# Patient Record
Sex: Female | Born: 1975 | Race: Black or African American | Hispanic: No | Smoking: Former smoker
Health system: Southern US, Community
[De-identification: ages and names within clinical notes are randomized; demographics above are authoritative.]

## PROBLEM LIST (undated history)

## (undated) DIAGNOSIS — Z8041 Family history of malignant neoplasm of ovary: Secondary | ICD-10-CM

## (undated) DIAGNOSIS — Z800 Family history of malignant neoplasm of digestive organs: Secondary | ICD-10-CM

## (undated) DIAGNOSIS — E349 Endocrine disorder, unspecified: Secondary | ICD-10-CM

## (undated) DIAGNOSIS — G4730 Sleep apnea, unspecified: Secondary | ICD-10-CM

## (undated) DIAGNOSIS — E282 Polycystic ovarian syndrome: Secondary | ICD-10-CM

## (undated) DIAGNOSIS — E785 Hyperlipidemia, unspecified: Secondary | ICD-10-CM

## (undated) DIAGNOSIS — N8501 Benign endometrial hyperplasia: Secondary | ICD-10-CM

## (undated) DIAGNOSIS — N6320 Unspecified lump in the left breast, unspecified quadrant: Secondary | ICD-10-CM

## (undated) DIAGNOSIS — F329 Major depressive disorder, single episode, unspecified: Secondary | ICD-10-CM

## (undated) DIAGNOSIS — Z8042 Family history of malignant neoplasm of prostate: Secondary | ICD-10-CM

## (undated) DIAGNOSIS — D649 Anemia, unspecified: Secondary | ICD-10-CM

## (undated) DIAGNOSIS — F32A Depression, unspecified: Secondary | ICD-10-CM

## (undated) DIAGNOSIS — N6452 Nipple discharge: Secondary | ICD-10-CM

## (undated) DIAGNOSIS — Z803 Family history of malignant neoplasm of breast: Secondary | ICD-10-CM

## (undated) DIAGNOSIS — N6011 Diffuse cystic mastopathy of right breast: Secondary | ICD-10-CM

## (undated) DIAGNOSIS — Z789 Other specified health status: Secondary | ICD-10-CM

## (undated) HISTORY — DX: Unspecified lump in the left breast, unspecified quadrant: N63.20

## (undated) HISTORY — DX: Family history of malignant neoplasm of ovary: Z80.41

## (undated) HISTORY — DX: Anemia, unspecified: D64.9

## (undated) HISTORY — DX: Nipple discharge: N64.52

## (undated) HISTORY — DX: Depression, unspecified: F32.A

## (undated) HISTORY — DX: Hyperlipidemia, unspecified: E78.5

## (undated) HISTORY — DX: Family history of malignant neoplasm of digestive organs: Z80.0

## (undated) HISTORY — DX: Major depressive disorder, single episode, unspecified: F32.9

## (undated) HISTORY — DX: Family history of malignant neoplasm of breast: Z80.3

## (undated) HISTORY — PX: NO PAST SURGERIES: SHX2092

## (undated) HISTORY — DX: Polycystic ovarian syndrome: E28.2

## (undated) HISTORY — DX: Family history of malignant neoplasm of prostate: Z80.42

---
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
## 1997-09-24 ENCOUNTER — Inpatient Hospital Stay (HOSPITAL_COMMUNITY): Admission: AD | Admit: 1997-09-24 | Discharge: 1997-09-24 | Payer: Self-pay | Admitting: *Deleted

## 1998-07-11 ENCOUNTER — Emergency Department (HOSPITAL_COMMUNITY): Admission: EM | Admit: 1998-07-11 | Discharge: 1998-07-11 | Payer: Self-pay

## 1999-01-02 ENCOUNTER — Emergency Department (HOSPITAL_COMMUNITY): Admission: EM | Admit: 1999-01-02 | Discharge: 1999-01-02 | Payer: Self-pay

## 1999-06-27 ENCOUNTER — Emergency Department (HOSPITAL_COMMUNITY): Admission: EM | Admit: 1999-06-27 | Discharge: 1999-06-27 | Payer: Self-pay | Admitting: Emergency Medicine

## 2001-09-19 ENCOUNTER — Emergency Department (HOSPITAL_COMMUNITY): Admission: EM | Admit: 2001-09-19 | Discharge: 2001-09-19 | Payer: Self-pay | Admitting: Emergency Medicine

## 2002-05-13 ENCOUNTER — Ambulatory Visit (HOSPITAL_BASED_OUTPATIENT_CLINIC_OR_DEPARTMENT_OTHER): Admission: RE | Admit: 2002-05-13 | Discharge: 2002-05-13 | Payer: Self-pay | Admitting: Family Medicine

## 2003-05-18 ENCOUNTER — Inpatient Hospital Stay (HOSPITAL_COMMUNITY): Admission: AD | Admit: 2003-05-18 | Discharge: 2003-05-18 | Payer: Self-pay | Admitting: Family Medicine

## 2003-09-03 ENCOUNTER — Emergency Department (HOSPITAL_COMMUNITY): Admission: EM | Admit: 2003-09-03 | Discharge: 2003-09-04 | Payer: Self-pay | Admitting: Emergency Medicine

## 2005-03-19 ENCOUNTER — Emergency Department (HOSPITAL_COMMUNITY): Admission: EM | Admit: 2005-03-19 | Discharge: 2005-03-19 | Payer: Self-pay | Admitting: *Deleted

## 2006-06-20 ENCOUNTER — Inpatient Hospital Stay (HOSPITAL_COMMUNITY): Admission: AD | Admit: 2006-06-20 | Discharge: 2006-06-21 | Payer: Self-pay | Admitting: Obstetrics & Gynecology

## 2007-05-05 ENCOUNTER — Ambulatory Visit (HOSPITAL_COMMUNITY): Admission: RE | Admit: 2007-05-05 | Discharge: 2007-05-05 | Payer: Self-pay | Admitting: Obstetrics and Gynecology

## 2012-05-28 HISTORY — PX: BREAST EXCISIONAL BIOPSY: SUR124

## 2012-06-23 ENCOUNTER — Other Ambulatory Visit: Payer: Self-pay | Admitting: *Deleted

## 2012-06-23 DIAGNOSIS — N6452 Nipple discharge: Secondary | ICD-10-CM

## 2012-06-23 LAB — HM PAP SMEAR: HM Pap smear: NEGATIVE

## 2012-07-04 ENCOUNTER — Ambulatory Visit
Admission: RE | Admit: 2012-07-04 | Discharge: 2012-07-04 | Disposition: A | Discharge status: Home / Self Care | Payer: BC Managed Care – PPO | Source: Ambulatory Visit | Attending: *Deleted | Admitting: *Deleted

## 2012-07-04 ENCOUNTER — Other Ambulatory Visit: Payer: Self-pay | Admitting: *Deleted

## 2012-07-04 DIAGNOSIS — N6452 Nipple discharge: Secondary | ICD-10-CM

## 2012-07-18 ENCOUNTER — Other Ambulatory Visit: Payer: Self-pay | Admitting: Obstetrics and Gynecology

## 2012-07-18 DIAGNOSIS — N6452 Nipple discharge: Secondary | ICD-10-CM

## 2012-07-25 ENCOUNTER — Ambulatory Visit
Admission: RE | Admit: 2012-07-25 | Discharge: 2012-07-25 | Disposition: A | Discharge status: Home / Self Care | Payer: BC Managed Care – PPO | Source: Ambulatory Visit | Attending: *Deleted | Admitting: *Deleted

## 2012-07-25 ENCOUNTER — Other Ambulatory Visit: Payer: Self-pay | Admitting: Obstetrics and Gynecology

## 2012-07-25 DIAGNOSIS — N6452 Nipple discharge: Secondary | ICD-10-CM

## 2012-09-15 ENCOUNTER — Ambulatory Visit
Admission: RE | Admit: 2012-09-15 | Discharge: 2012-09-15 | Disposition: A | Discharge status: Home / Self Care | Payer: BC Managed Care – PPO | Source: Ambulatory Visit | Attending: Obstetrics and Gynecology | Admitting: Obstetrics and Gynecology

## 2012-09-15 ENCOUNTER — Telehealth: Payer: Self-pay | Admitting: Obstetrics and Gynecology

## 2012-09-15 DIAGNOSIS — N6452 Nipple discharge: Secondary | ICD-10-CM

## 2012-09-15 MED ORDER — GADOBENATE DIMEGLUMINE 529 MG/ML IV SOLN
18.0000 mL | Freq: Once | INTRAVENOUS | Status: AC | PRN
  Administered 2012-09-15: 18 mL via INTRAVENOUS

## 2012-09-15 NOTE — Telephone Encounter (Signed)
Hi Fannie Knee,  This is a chart in mammogram hold.  Patient is now scheduled for surgical consultation.  Thank you,  Conley Simmonds

## 2012-09-15 NOTE — Telephone Encounter (Signed)
Patient was referred for left breast bloody nipple discharge.  Had MRI which showed a small mass.  Patient referred to Dr. Jamey Ripa and has appointment on 09/23/12 for a consultation.

## 2012-09-23 ENCOUNTER — Ambulatory Visit (INDEPENDENT_AMBULATORY_CARE_PROVIDER_SITE_OTHER): Payer: BC Managed Care – PPO | Admitting: Surgery

## 2012-10-02 ENCOUNTER — Ambulatory Visit (INDEPENDENT_AMBULATORY_CARE_PROVIDER_SITE_OTHER): Payer: BC Managed Care – PPO | Admitting: Surgery

## 2012-10-10 ENCOUNTER — Ambulatory Visit (INDEPENDENT_AMBULATORY_CARE_PROVIDER_SITE_OTHER): Payer: BC Managed Care – PPO | Admitting: Surgery

## 2012-10-10 ENCOUNTER — Encounter (INDEPENDENT_AMBULATORY_CARE_PROVIDER_SITE_OTHER): Payer: Self-pay | Admitting: Surgery

## 2012-10-10 VITALS — BP 116/88 | HR 80 | Temp 97.6°F | Resp 16 | Ht 64.0 in | Wt 191.0 lb

## 2012-10-10 DIAGNOSIS — N6320 Unspecified lump in the left breast, unspecified quadrant: Secondary | ICD-10-CM

## 2012-10-10 DIAGNOSIS — N63 Unspecified lump in unspecified breast: Secondary | ICD-10-CM

## 2012-10-10 HISTORY — DX: Unspecified lump in the left breast, unspecified quadrant: N63.20

## 2012-10-10 NOTE — Progress Notes (Signed)
Kristy Stafford DOB: Aug 12, 1975 MRN: [id number]                                                                                      DATE: 10/10/2012  PCP: Kristy Helling, MD Referring Provider: Harrel Lemon, MD  IMPRESSION:  Left nipple discharge, single duct, with apparent associated 8 mm subareolar mass Family history of breast cancer (mother and grandmother)  PLAN:   I have recommended left breast ductal excision.I reviewed the plans the patient and she is agreeable. I will review the MRI with the radiologist to be sure they think the small abnormality associated with the fluid producing duct.                 CC:  Chief Complaint  Patient presents with  . Breast Problem    new pt- eval lt nipple d/c    HPI:  Kristy Stafford is a 37 y.o.  female who presents for evaluation of left nipple discharge that has been persistent, but asymptomatic otherwise. She has a family history in the mother and grandmother of breast cancer. She has no other history of breast cancer and no breast symptoms at all other than spontaneous left nipple discharge. She has had 2 unsuccessful attempts at ductograms. Mammogram and ultrasound have been negative. An MRI has shown an 8 mm subareolar lesion at about the 6:00 to 7:00 position. The nipple discharge was noted by the radiologist to be at about the 4:00 position but also thought likely to be associated with the small mass.  PMH:  has a past medical history of Hyperlipidemia and Nipple discharge.  PSH:   has no past surgical history on file.  ALLERGIES:  No Known Allergies  MEDICATIONS: No current outpatient prescriptions on file.  ROS: She has filled out our 12 point review of systems and it is negative . EXAM:   VITAL SIGNS:  BP 116/88  Pulse 80  Temp(Src) 97.6 F (36.4 C) (Temporal)  Resp 16  Ht 5\' 4"  (1.626 m)  Wt 191 lb (86.637 kg)  BMI 32.77 kg/m2  LMP 09/07/2012  GENERAL:  The patient is alert, oriented, and  generally healthy-appearing, NAD. Mood and affect are normal.  HEENT:  The head is normocephalic, the eyes nonicteric, the pupils were round regular and equal. EOMs are normal. Pharynx normal. Dentition good.  NECK:  The neck is supple and there are no masses or thyromegaly.  LUNGS: Normal respirations and clear to auscultation.  HEART: Regular rhythm, with no murmurs rubs or gallops. Pulses are intact carotid dorsalis pedis and posterior tibial. No significant varicosities are noted.  BREASTS:  the breasts are somewhat large, nontender, symmetric, with no evidence of scanner nipple areolar lesions. There is no nipple discharge on the right. There is a readily expressible somewhat clear discharge from approximately the 6:00 position in the left nipple. There is no underlying palpable mass or other abnormality.  LYMPHATICS: There is no axillary or supraclavicular adenopathy noted  ABDOMEN: Soft, flat, and nontender. No masses or organomegaly is noted. No hernias are noted. Bowel sounds are normal.  EXTREMITIES:  Good range of motion, no  edema.   DATA REVIEWED:  I have reviewed the mammogram ultrasound and ductogram reports and the MRI report    Kristy Stafford Kristy Stafford 10/10/2012  CC: Kristy Lemon, MD, Kristy de Gwenevere Ghazi, MD

## 2012-10-10 NOTE — Patient Instructions (Signed)
We will schedule surgery for her move the ductal tissue from behind the left nipple that  is causing the discharge

## 2012-11-19 ENCOUNTER — Encounter (HOSPITAL_BASED_OUTPATIENT_CLINIC_OR_DEPARTMENT_OTHER): Payer: Self-pay | Admitting: *Deleted

## 2012-11-19 NOTE — Progress Notes (Signed)
To take piercing out-to bring cpap and told she would have to use post op-possibly stay overnight if any problems breathing

## 2012-11-24 ENCOUNTER — Encounter (HOSPITAL_BASED_OUTPATIENT_CLINIC_OR_DEPARTMENT_OTHER)
Admission: RE | Disposition: A | Discharge status: Home / Self Care | Payer: Self-pay | Source: Ambulatory Visit | Attending: Surgery

## 2012-11-24 ENCOUNTER — Encounter (HOSPITAL_BASED_OUTPATIENT_CLINIC_OR_DEPARTMENT_OTHER): Payer: Self-pay | Admitting: Certified Registered Nurse Anesthetist

## 2012-11-24 ENCOUNTER — Ambulatory Visit (HOSPITAL_BASED_OUTPATIENT_CLINIC_OR_DEPARTMENT_OTHER): Payer: BC Managed Care – PPO | Admitting: Certified Registered Nurse Anesthetist

## 2012-11-24 ENCOUNTER — Ambulatory Visit (HOSPITAL_BASED_OUTPATIENT_CLINIC_OR_DEPARTMENT_OTHER)
Admission: RE | Admit: 2012-11-24 | Discharge: 2012-11-24 | Disposition: A | Discharge status: Home / Self Care | Payer: BC Managed Care – PPO | Source: Ambulatory Visit | Attending: Surgery | Admitting: Surgery

## 2012-11-24 DIAGNOSIS — G4730 Sleep apnea, unspecified: Secondary | ICD-10-CM | POA: Insufficient documentation

## 2012-11-24 DIAGNOSIS — N6320 Unspecified lump in the left breast, unspecified quadrant: Secondary | ICD-10-CM

## 2012-11-24 DIAGNOSIS — E785 Hyperlipidemia, unspecified: Secondary | ICD-10-CM | POA: Insufficient documentation

## 2012-11-24 DIAGNOSIS — Z803 Family history of malignant neoplasm of breast: Secondary | ICD-10-CM | POA: Insufficient documentation

## 2012-11-24 DIAGNOSIS — N6019 Diffuse cystic mastopathy of unspecified breast: Secondary | ICD-10-CM | POA: Insufficient documentation

## 2012-11-24 DIAGNOSIS — D249 Benign neoplasm of unspecified breast: Secondary | ICD-10-CM

## 2012-11-24 HISTORY — DX: Sleep apnea, unspecified: G47.30

## 2012-11-24 HISTORY — PX: BREAST DUCTAL SYSTEM EXCISION: SHX5242

## 2012-11-24 HISTORY — DX: Other specified health status: Z78.9

## 2012-11-24 SURGERY — EXCISION DUCTAL SYSTEM BREAST
Anesthesia: General | Site: Breast | Laterality: Left | Wound class: Clean

## 2012-11-24 MED ORDER — CEFAZOLIN SODIUM-DEXTROSE 2-3 GM-% IV SOLR
2.0000 g | INTRAVENOUS | Status: AC
  Administered 2012-11-24: 2 g via INTRAVENOUS

## 2012-11-24 MED ORDER — PROPOFOL 10 MG/ML IV BOLUS
INTRAVENOUS | Status: DC | PRN
  Administered 2012-11-24: 200 mg via INTRAVENOUS

## 2012-11-24 MED ORDER — KETOROLAC TROMETHAMINE 30 MG/ML IJ SOLN: 15.0000 mg | Freq: Once | INTRAMUSCULAR | Status: DC | PRN

## 2012-11-24 MED ORDER — CHLORHEXIDINE GLUCONATE 4 % EX LIQD: 1.0000 "application " | Freq: Once | CUTANEOUS | Status: DC

## 2012-11-24 MED ORDER — LIDOCAINE HCL (CARDIAC) 20 MG/ML IV SOLN
INTRAVENOUS | Status: DC | PRN
  Administered 2012-11-24: 60 mg via INTRAVENOUS

## 2012-11-24 MED ORDER — FENTANYL CITRATE 0.05 MG/ML IJ SOLN
INTRAMUSCULAR | Status: DC | PRN
  Administered 2012-11-24 (×2): 50 ug via INTRAVENOUS

## 2012-11-24 MED ORDER — MIDAZOLAM HCL 5 MG/5ML IJ SOLN
INTRAMUSCULAR | Status: DC | PRN
  Administered 2012-11-24: 2 mg via INTRAVENOUS

## 2012-11-24 MED ORDER — LACTATED RINGERS IV SOLN
INTRAVENOUS | Status: DC
  Administered 2012-11-24 (×2): via INTRAVENOUS

## 2012-11-24 MED ORDER — ONDANSETRON HCL 4 MG/2ML IJ SOLN: 4.0000 mg | Freq: Once | INTRAMUSCULAR | Status: DC | PRN

## 2012-11-24 MED ORDER — HYDROMORPHONE HCL PF 1 MG/ML IJ SOLN
0.2500 mg | INTRAMUSCULAR | Status: DC | PRN
  Administered 2012-11-24: 0.5 mg via INTRAVENOUS

## 2012-11-24 MED ORDER — BUPIVACAINE HCL (PF) 0.25 % IJ SOLN
INTRAMUSCULAR | Status: DC | PRN
  Administered 2012-11-24: 30 mL

## 2012-11-24 MED ORDER — HYDROCODONE-ACETAMINOPHEN 5-325 MG PO TABS: 1.0000 | ORAL_TABLET | ORAL | Status: DC | PRN

## 2012-11-24 SURGICAL SUPPLY — 51 items
ADH SKN CLS APL DERMABOND .7 (GAUZE/BANDAGES/DRESSINGS) ×1
APPLICATOR COTTON TIP 6IN STRL (MISCELLANEOUS) IMPLANT
BANDAGE ELASTIC 6 VELCRO ST LF (GAUZE/BANDAGES/DRESSINGS) IMPLANT
BINDER BREAST XLRG (GAUZE/BANDAGES/DRESSINGS) ×1 IMPLANT
BLADE HEX COATED 2.75 (ELECTRODE) ×2 IMPLANT
BLADE SURG 15 STRL LF DISP TIS (BLADE) ×1 IMPLANT
BLADE SURG 15 STRL SS (BLADE) ×2
BNDG COHESIVE 6X5 TAN STRL LF (GAUZE/BANDAGES/DRESSINGS) IMPLANT
CANISTER SUCTION 1200CC (MISCELLANEOUS) ×2 IMPLANT
CHLORAPREP W/TINT 26ML (MISCELLANEOUS) ×2 IMPLANT
CLIP TI MEDIUM 6 (CLIP) IMPLANT
CLIP TI WIDE RED SMALL 6 (CLIP) IMPLANT
CLOTH BEACON ORANGE TIMEOUT ST (SAFETY) ×2 IMPLANT
COVER MAYO STAND STRL (DRAPES) ×2 IMPLANT
COVER TABLE BACK 60X90 (DRAPES) ×2 IMPLANT
DECANTER SPIKE VIAL GLASS SM (MISCELLANEOUS) IMPLANT
DERMABOND ADVANCED (GAUZE/BANDAGES/DRESSINGS) ×1
DERMABOND ADVANCED .7 DNX12 (GAUZE/BANDAGES/DRESSINGS) ×1 IMPLANT
DRAPE LAPAROTOMY TRNSV 102X78 (DRAPE) ×2 IMPLANT
DRAPE SURG 17X23 STRL (DRAPES) IMPLANT
DRAPE UTILITY XL STRL (DRAPES) ×2 IMPLANT
ELECT REM PT RETURN 9FT ADLT (ELECTROSURGICAL) ×2
ELECTRODE REM PT RTRN 9FT ADLT (ELECTROSURGICAL) ×1 IMPLANT
GLOVE BIO SURGEON STRL SZ7 (GLOVE) ×1 IMPLANT
GLOVE BIOGEL PI IND STRL 7.0 (GLOVE) IMPLANT
GLOVE BIOGEL PI INDICATOR 7.0 (GLOVE) ×1
GLOVE EUDERMIC 7 POWDERFREE (GLOVE) ×2 IMPLANT
GOWN PREVENTION PLUS XLARGE (GOWN DISPOSABLE) ×4 IMPLANT
NDL HYPO 25X1 1.5 SAFETY (NEEDLE) ×1 IMPLANT
NDL SAFETY ECLIPSE 18X1.5 (NEEDLE) IMPLANT
NEEDLE HYPO 18GX1.5 SHARP (NEEDLE) ×2
NEEDLE HYPO 25X1 1.5 SAFETY (NEEDLE) ×2 IMPLANT
NS IRRIG 1000ML POUR BTL (IV SOLUTION) ×1 IMPLANT
PACK BASIN DAY SURGERY FS (CUSTOM PROCEDURE TRAY) ×2 IMPLANT
PENCIL BUTTON HOLSTER BLD 10FT (ELECTRODE) ×2 IMPLANT
SLEEVE SCD COMPRESS KNEE MED (MISCELLANEOUS) ×2 IMPLANT
SPONGE GAUZE 4X4 12PLY (GAUZE/BANDAGES/DRESSINGS) IMPLANT
SPONGE INTESTINAL PEANUT (DISPOSABLE) IMPLANT
SPONGE LAP 4X18 X RAY DECT (DISPOSABLE) ×2 IMPLANT
STAPLER VISISTAT 35W (STAPLE) IMPLANT
SUT MNCRL AB 4-0 PS2 18 (SUTURE) ×2 IMPLANT
SUT SILK 0 TIES 10X30 (SUTURE) IMPLANT
SUT VIC AB 3-0 CT1 27 (SUTURE)
SUT VIC AB 3-0 CT1 27XBRD (SUTURE) IMPLANT
SUT VICRYL 3-0 CR8 SH (SUTURE) ×2 IMPLANT
SYR BULB 3OZ (MISCELLANEOUS) IMPLANT
SYR CONTROL 10ML LL (SYRINGE) ×2 IMPLANT
SYR TB 1ML 25GX5/8 (SYRINGE) ×1 IMPLANT
TOWEL OR NON WOVEN STRL DISP B (DISPOSABLE) ×2 IMPLANT
TUBE CONNECTING 20X1/4 (TUBING) ×2 IMPLANT
YANKAUER SUCT BULB TIP NO VENT (SUCTIONS) ×2 IMPLANT

## 2012-11-24 NOTE — Anesthesia Procedure Notes (Signed)
Procedure Name: LMA Insertion Date/Time: 11/24/2012 7:35 AM Performed by: Allysen Lazo D Pre-anesthesia Checklist: Patient identified, Emergency Drugs available, Suction available and Patient being monitored Patient Re-evaluated:Patient Re-evaluated prior to inductionOxygen Delivery Method: Circle System Utilized Preoxygenation: Pre-oxygenation with 100% oxygen Intubation Type: IV induction Ventilation: Mask ventilation without difficulty LMA: LMA inserted LMA Size: 4.0 Number of attempts: 1 Airway Equipment and Method: bite block Placement Confirmation: positive ETCO2 Tube secured with: Tape Dental Injury: Teeth and Oropharynx as per pre-operative assessment

## 2012-11-24 NOTE — Op Note (Signed)
ALONDRIA MOUSSEAU 1975/07/18 [id number] 10/10/2012  Preoperative diagnosis: left breast nipple discharge with small associated mass seen on MRI, 6:00 position  Postoperative diagnosis: same  Procedure: ductal excision left breast  Surgeon: Currie Paris, MD, FACS  Anesthesia: General   Clinical History and Indications: this patient has had a spontaneous left breast nipple discharge from a duct at about the 4:00 position. Despite several attempts a ductogram has not been able to be performed. Mammogram show no definite abnormality but an MRI showed a small nodule associated with dilated ducts at about the 6:00 position, thought to be the same area that is producing the nipple discharge.    Description of Procedure: I saw the patient in the preoperative area, confirmed the plans with her, and marked the left breast as the operative site. The patient was taken to the operating room and after satisfactory general anesthesia was obtained the left breast was prepped and draped and a timeout was performed.  I attempted to cannulate the duct previously the fluid which was in about the 6:00 position but was unsuccessful after numerous attempts. I then made a curvilinear incision at the bottom of the trailer margin. I elevated the skin up until I got to the undersurface of the nipple. There I encountered a markedly dilated duct. I dissected around it with a hemostat and divided it the dermis level of the nipple. With traction on it I was able to take wide area of tissue around this duct. Most of the subareolar tissue was fatty. I took some additional tissue around the edges and from the 4:00 position but I thought I had completely excised the subareolar ductal tissue.  I put in 30 cc of 0.25% plain Marcaine to help with postop pain control. I irrigated and made sure everything was dry. I closed in layers with 3-0 Vicryl, 4-0 Vicryl subcuticular, plus Dermabond.  The patient tolerated the procedure well.  There were no operative complications. Counts were correct. Estimated blood loss was minimal.  Currie Paris, MD, FACS 11/24/2012 8:20 AM

## 2012-11-24 NOTE — Anesthesia Preprocedure Evaluation (Addendum)
Anesthesia Evaluation  Patient identified by MRN, date of birth, ID band Patient awake    Reviewed: Allergy & Precautions  Airway Mallampati: II      Dental  (+) Teeth Intact and Dental Advisory Given   Pulmonary sleep apnea ,  breath sounds clear to auscultation        Cardiovascular Rhythm:Regular Rate:Normal     Neuro/Psych    GI/Hepatic   Endo/Other    Renal/GU      Musculoskeletal   Abdominal   Peds  Hematology   Anesthesia Other Findings   Reproductive/Obstetrics                          Anesthesia Physical Anesthesia Plan  ASA: II  Anesthesia Plan: General   Post-op Pain Management:    Induction: Intravenous  Airway Management Planned: LMA  Additional Equipment:   Intra-op Plan:   Post-operative Plan:   Informed Consent: I have reviewed the patients History and Physical, chart, labs and discussed the procedure including the risks, benefits and alternatives for the proposed anesthesia with the patient or authorized representative who has indicated his/her understanding and acceptance.   Dental advisory given  Plan Discussed with: CRNA and Anesthesiologist  Anesthesia Plan Comments:         Anesthesia Quick Evaluation

## 2012-11-24 NOTE — H&P (Signed)
  HPI: Kristy Stafford is a 37 y.o. female who presents for surgery for a left nipple discharge She has a family history in the mother and grandmother of breast cancer. She has no other history of breast cancer and no breast symptoms at all other than spontaneous left nipple discharge. She has had 2 unsuccessful attempts at ductograms. Mammogram and ultrasound have been negative. An MRI has shown an 8 mm subareolar lesion at about the 6:00 to 7:00 position. The nipple discharge was noted by the radiologist to be at about the 4:00 position but also thought likely to be associated with the small mass.  PMH: has a past medical history of Hyperlipidemia and Nipple discharge.  PSH: has no past surgical history on file.  ALLERGIES: No Known Allergies  MEDICATIONS: No current outpatient prescriptions on file.  ROS: She  has filled out our 12 point review of systems and it is negative .  EXAM:  VITAL SIGNS:  BP 116/88  Pulse 80  Temp(Src) 97.6 F (36.4 C) (Temporal)  Resp 16  Ht 5\' 4"  (1.626 m)  Wt 191 lb (86.637 kg)  BMI 32.77 kg/m2  LMP 09/07/2012  GENERAL:  The patient is alert, oriented, and generally healthy-appearing, NAD. Mood and affect are normal.  HEENT:  The head is normocephalic, the eyes nonicteric, the pupils were round regular and equal. EOMs are normal. Pharynx normal. Dentition good.  NECK:  The neck is supple and there are no masses or thyromegaly.  LUNGS:  Normal respirations and clear to auscultation.  HEART:  Regular rhythm, with no murmurs rubs or gallops. Pulses are intact carotid dorsalis pedis and posterior tibial. No significant varicosities are noted.  BREASTS:  the breasts are somewhat large, nontender, symmetric, with no evidence of scanner nipple areolar lesions. There is no nipple discharge on the right. There is a readily expressible somewhat clear discharge from approximately the 6:00 position in the left nipple. There is no underlying palpable mass or other  abnormality.  LYMPHATICS: There is no axillary or supraclavicular adenopathy noted  ABDOMEN:  Soft, flat, and nontender. No masses or organomegaly is noted. No hernias are noted. Bowel sounds are normal.  EXTREMITIES:  Good range of motion, no edema.  DATA REVIEWED: I have reviewed the mammogram ultrasound and ductogram reports and the MRI report  IMP: Left nipple discharge with possible small associated Mass  Plan: Left breast ductal excision. I reviewed plans, etc again with patient and family and all questions answered. I have marked the left breast as the operative side

## 2012-11-24 NOTE — Transfer of Care (Signed)
Immediate Anesthesia Transfer of Care Note  Patient: Kristy Stafford  Procedure(s) Performed: Procedure(s): EXCISION DUCTAL SYSTEM BREAST (Left)  Patient Location: PACU  Anesthesia Type:General  Level of Consciousness: awake and patient cooperative  Airway & Oxygen Therapy: Patient Spontanous Breathing and Patient connected to face mask oxygen  Post-op Assessment: Report given to PACU RN and Post -op Vital signs reviewed and stable  Post vital signs: Reviewed and stable  Complications: No apparent anesthesia complications

## 2012-11-24 NOTE — Anesthesia Postprocedure Evaluation (Signed)
  Anesthesia Post-op Note  Patient: [name] Performed: Procedure(s): EXCISION DUCTAL SYSTEM BREAST (Left)  Patient Location: PACU  Anesthesia Type:General  Level of Consciousness: awake, alert  and oriented  Airway and Oxygen Therapy: Patient Spontanous Breathing  Post-op Pain: mild  Post-op Assessment: Post-op Vital signs reviewed, Patient's Cardiovascular Status Stable, Respiratory Function Stable, Patent Airway and Pain level controlled  Post-op Vital Signs: stable  Complications: No apparent anesthesia complications

## 2012-11-25 ENCOUNTER — Encounter (HOSPITAL_BASED_OUTPATIENT_CLINIC_OR_DEPARTMENT_OTHER): Payer: Self-pay | Admitting: Surgery

## 2012-11-26 ENCOUNTER — Telehealth (INDEPENDENT_AMBULATORY_CARE_PROVIDER_SITE_OTHER): Payer: Self-pay | Admitting: General Surgery

## 2012-11-26 NOTE — Telephone Encounter (Signed)
Patient called back and was made aware per Haig Prophet that pathology benign.

## 2012-11-26 NOTE — Telephone Encounter (Signed)
Message copied by Liliana Cline on Wed Nov 26, 2012  9:51 AM ------      Message from: Currie Paris      Created: Tue Nov 25, 2012  5:19 PM       Tell her path is benign and as expected ------

## 2012-11-26 NOTE — Telephone Encounter (Signed)
Left message on machine for patient to call back and ask for me. To make her aware pathology benign. Awaiting call back.

## 2012-12-05 ENCOUNTER — Encounter (INDEPENDENT_AMBULATORY_CARE_PROVIDER_SITE_OTHER): Payer: BC Managed Care – PPO | Admitting: Surgery

## 2012-12-12 ENCOUNTER — Encounter (INDEPENDENT_AMBULATORY_CARE_PROVIDER_SITE_OTHER): Payer: Self-pay | Admitting: Surgery

## 2012-12-12 ENCOUNTER — Encounter (INDEPENDENT_AMBULATORY_CARE_PROVIDER_SITE_OTHER): Payer: Self-pay

## 2012-12-12 ENCOUNTER — Ambulatory Visit (INDEPENDENT_AMBULATORY_CARE_PROVIDER_SITE_OTHER): Payer: BC Managed Care – PPO | Admitting: Surgery

## 2012-12-12 VITALS — BP 130/68 | HR 64 | Resp 16 | Ht 64.0 in | Wt 189.0 lb

## 2012-12-12 DIAGNOSIS — N63 Unspecified lump in unspecified breast: Secondary | ICD-10-CM

## 2012-12-12 DIAGNOSIS — N6320 Unspecified lump in the left breast, unspecified quadrant: Secondary | ICD-10-CM

## 2012-12-12 NOTE — Patient Instructions (Signed)
We will see you again on an as needed basis. Please call the office at [phone] if you have any questions or concerns. Thank you for allowing us to take care of you.  

## 2012-12-12 NOTE — Progress Notes (Signed)
NAME: Kristy Stafford                                            DOB: 07/03/1975 DATE: 12/12/2012                                                  MRN: [id number]  CC:  Chief Complaint  Patient presents with  . Routine Post Op    left breast    HPI: This patient comes in for post op follow-up .Sheunderwent left ductal excision on 11/24/12. She feels that she is doing well.  PE:  VITAL SIGNS: BP 130/68  Pulse 64  Resp 16  Ht 5\' 4"  (1.626 m)  Wt 189 lb (85.73 kg)  BMI 32.43 kg/m2  LMP 11/12/2012  General: The patient appears to be healthy, NAD Incision: Healing nicely  DATA REVIEWED: Diagnosis Breast, excision, Left - DUCTAL PAPILLOMA - FIBROCYSTIC CHANGES. - NO EVIDENCE OF MALIGNANCY IDENTIFIED. 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 There are also benign fibrocystic changes. No malignancy is identified. (JDP;gt, 11/25/12) Jimmy Picket MD Pathologist, Electronic Signature  IMPRESSION: The patient is doing well S/P left ductal excision.    PLAN: RTC PRN I gave the patient a copy of the pathology report and reviewed it with her

## 2013-01-15 ENCOUNTER — Telehealth: Payer: Self-pay | Admitting: *Deleted

## 2013-01-15 NOTE — Telephone Encounter (Signed)
Patient notified of need to schedule a follow up appt. With Dr. Edward Jolly from OV paper chart of 07/07/2012 of irregular menses. Appointment made for Monday , 01/19/2013 with Dr. Edward Jolly @ 11:15am.

## 2013-01-19 ENCOUNTER — Ambulatory Visit (INDEPENDENT_AMBULATORY_CARE_PROVIDER_SITE_OTHER): Payer: BC Managed Care – PPO | Admitting: Obstetrics and Gynecology

## 2013-01-19 ENCOUNTER — Encounter: Payer: Self-pay | Admitting: Obstetrics and Gynecology

## 2013-01-19 ENCOUNTER — Telehealth: Payer: Self-pay | Admitting: Obstetrics and Gynecology

## 2013-01-19 ENCOUNTER — Ambulatory Visit: Payer: Self-pay | Admitting: Obstetrics and Gynecology

## 2013-01-19 VITALS — BP 124/76 | HR 70 | Ht 64.0 in | Wt 190.0 lb

## 2013-01-19 DIAGNOSIS — N926 Irregular menstruation, unspecified: Secondary | ICD-10-CM

## 2013-01-19 DIAGNOSIS — N939 Abnormal uterine and vaginal bleeding, unspecified: Secondary | ICD-10-CM

## 2013-01-19 NOTE — Telephone Encounter (Signed)
Patient is thinks she has a credit in [place] (I do not see a credit) Patient says at one of her appointments she paid a copay and was not seen. Patient was told this would be applied to her next visit.

## 2013-01-19 NOTE — Patient Instructions (Signed)

## 2013-01-19 NOTE — Progress Notes (Signed)
Patient ID: [facility], female   DOB: 12-Sep-1975, 37 y.o.   MRN: [id number] GYNECOLOGY VISIT  PCP:    Dr. Johnnette Barrios  Referring provider:   HPI: 37 y.o.   Married  [place]  female   [phone] with Patient's last menstrual period was 01/10/2013.   here for  Abnormal Uterine Bleeding  Menses occurring range from a normal one week of bleeding to bloody discharge that requires only panty line use and last for a couple of weeks. Can skip cycles also. Had a normal TSH, prolactin, LH, and FSH.  LH>>FSH.  No pain.   Recent fever due to a tooth abscess - just had tooth extraction 5 days ago.    Recent reunion with ex-husband.   Considering future childbearing.  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  Pathology benign.  Patient states the breast feels really firm.  Labs today:     UPT:  negative  GYNECOLOGIC HISTORY: Patient's last menstrual period was 01/10/2013. Menses:  irregular occurring approximately every 20-120 days with spotting approximately 7-20 days per month Bleeding:  Patient may go months without a cycle but also may have 2 cycles in one month.  Sexually active:  yes Partner preference: female Contraception:  none Hormone therapy:  DES exposure:  denies Blood transfusions:  none Sexually transmitted diseases:  The patient denies history of sexually transmitted disease. Previous GYN Procedures:  no Last mammogram:  abnormal: left focal duct ectasia Date:   06/2012               Location: [facility] Last pap:  normal Date: 06-23-12 History of abnormal pap smear:  no   OB History   Grav Para Term Preterm Abortions TAB SAB Ect Mult Living   3    3  3            Family History  Problem Relation Age of Onset  . Cancer Mother     breast  . Breast cancer Mother   . Diabetes Father   . Cancer Maternal Grandmother     breast  . Breast cancer Maternal Grandmother   . Ovarian cancer Maternal Aunt     There are  no active problems to display for this patient.   Past Medical History  Diagnosis Date  . Hyperlipidemia   . Nipple discharge     left breast  . Sleep apnea     has a cpap for 3 yr-told she will need to bring day of surgery and use post op  . Medical history non-contributory   . Breast mass, left 10/10/2012    Excised 11/24/12, papilloma on path     Past Surgical History  Procedure Laterality Date  . No past surgeries    . Breast ductal system excision Left 11/24/2012    Procedure: EXCISION DUCTAL SYSTEM BREAST;  Surgeon: Currie Paris, MD;  Location: [facility];  Service: General;  Laterality: Left;    ALLERGIES: Review of patient's allergies indicates no known allergies.  Current Outpatient Prescriptions  Medication Sig Dispense Refill  . HYDROcodone-acetaminophen (NORCO) 5-325 MG per tablet Take 1 tablet by mouth every 4 (four) hours as needed for pain.  30 tablet  0  . naproxen sodium (ANAPROX) 220 MG tablet Take 220 mg by mouth as needed.       No current facility-administered medications for this visit.     ROS:  Pertinent items are noted in HPI.  SOCIAL HISTORY:  Divorced.  Smokes cigars.  PHYSICAL EXAMINATION:    BP 124/76  Pulse 70  Ht 5\' 4"  (1.626 m)  Wt 190 lb (86.183 kg)  BMI 32.6 kg/m2  LMP 01/10/2013   Wt Readings from Last 3 Encounters:  01/19/13 190 lb (86.183 kg)  12/12/12 189 lb (85.73 kg)  11/24/12 192 lb 6.4 oz (87.272 kg)     Ht Readings from Last 3 Encounters:  01/19/13 5\' 4"  (1.626 m)  12/12/12 5\' 4"  (1.626 m)  11/24/12 5\' 4"  (1.626 m)    General appearance: alert, cooperative and appears stated age Head: Normocephalic, without obvious abnormality, atraumatic Neck: no adenopathy, supple, symmetrical, trachea midline and thyroid not enlarged, symmetric, no tenderness/mass/nodules Lungs: clear to auscultation bilaterally Breasts: Inspection negative, No nipple retraction or dimpling, No nipple discharge or bleeding, No  axillary or supraclavicular adenopathy, Normal to palpation without dominant masses on the right.  Left breast with infraareolar incision and 2.5 cm firm mass under the areola and to the left. Heart: regular rate and rhythm Abdomen: obese, soft, non-tender; bowel sounds normal; no masses,  no organomegaly Extremities: extremities normal, atraumatic, no cyanosis or edema Skin: Skin color, texture, turgor normal. No rashes or lesions Lymph nodes: Cervical, supraclavicular, and axillary nodes normal. No abnormal inguinal nodes palpated Neurologic: Grossly normal   Pelvic: External genitalia:  no lesions              Urethra:  normal appearing urethra with no masses, tenderness or lesions              Bartholins and Skenes: normal                 Vagina: normal appearing vagina with normal color and discharge, no lesions              Cervix: normal appearance            Bimanual Exam:  Uterus:  uterus is normal size, shape, consistency and nontender                                      Adnexa: normal adnexa in size, nontender and no masses                                      Rectovaginal: Confirms                                      Anus:  normal sphincter tone, no lesions  ASSESSMENT   Status post left breast papilloma excision 2 months ago.  Induration of left breast. Long history of anovulatory bleeding Considering future childbearing. Smoker age 37 years old.  PLAN  Patient will contact Dr. Tenna Child office for a recheck.  She declines having our office call to schedule. Return for pelvic ultrasound, sonohysterogram, and endometrial biopsy. I discussed future pregnancy planning and 37 status.  Declines STD testing today. Not a candidate for combined contraception due to tobacco use.    An After Visit Summary was printed and given to the patient.

## 2013-01-29 ENCOUNTER — Telehealth: Payer: Self-pay | Admitting: Obstetrics and Gynecology

## 2013-01-29 NOTE — Telephone Encounter (Signed)
LMTCB to discuss ins benefits and schedule [facility]/Endo Bx.

## 2013-02-12 ENCOUNTER — Ambulatory Visit (INDEPENDENT_AMBULATORY_CARE_PROVIDER_SITE_OTHER): Payer: BC Managed Care – PPO | Admitting: Obstetrics and Gynecology

## 2013-02-12 ENCOUNTER — Ambulatory Visit (INDEPENDENT_AMBULATORY_CARE_PROVIDER_SITE_OTHER): Payer: BC Managed Care – PPO

## 2013-02-12 DIAGNOSIS — R9389 Abnormal findings on diagnostic imaging of other specified body structures: Secondary | ICD-10-CM

## 2013-02-12 DIAGNOSIS — N926 Irregular menstruation, unspecified: Secondary | ICD-10-CM

## 2013-02-12 DIAGNOSIS — N939 Abnormal uterine and vaginal bleeding, unspecified: Secondary | ICD-10-CM

## 2013-02-12 DIAGNOSIS — N915 Oligomenorrhea, unspecified: Secondary | ICD-10-CM

## 2013-02-12 NOTE — Patient Instructions (Signed)
We will call you with the test results in about a week,

## 2013-02-12 NOTE — Progress Notes (Signed)
Patient ID: [facility], female   DOB: 06-21-1975, 37 y.o.   MRN: [id number]  Subjective  Patient is here for pelvic ultrasound and endometrial biopsy. LMP was 6 days ago.  Bleeding was light brown.  Patient has know polycystic ovarian disease and oligomenorrhea.  Occasionally smokes.   Would welcome pregnancy if it occurred.  Not really preventing pregnancy.  Has high cholesterol and normal glucose.  - Dr. Parke Simmers.   Objective  Ultrasound - EMS 12 mm, no fibroids, ovaries WNL, no free fluid.  Sonohysterogram   Consent performed.  Open sided speculum place.  Sterile prep of cervix with Betadine. Tenaculum to anterior cervical lip.  Cannula placed.  Speculum withdrawn Saline injected.  Images taken.  No complications.  Bleeding noted after sonohysterogram performed - came from the lush lining of the endometrium being partially disrupted.  Endometrial biopsy  Consent performed.  Speculum placed.  Reprep with betadine.  Pipelle passed x 2 without complication.  Tissue to pathology.  Minimal EBL.  Assessment  Abnormal uterine bleeding. Oligomenorrhea. Known polycystic ovarian disease. Would welcome pregnancy.  Plan  Will await EMB results. For any progesterone treatment, will use Prometrium. Start PNV

## 2013-02-16 ENCOUNTER — Telehealth: Payer: Self-pay | Admitting: Obstetrics and Gynecology

## 2013-02-16 DIAGNOSIS — N8501 Benign endometrial hyperplasia: Secondary | ICD-10-CM

## 2013-02-16 MED ORDER — MEDROXYPROGESTERONE ACETATE 10 MG PO TABS: 10.0000 mg | ORAL_TABLET | Freq: Every day | ORAL | Status: DC

## 2013-02-16 NOTE — Telephone Encounter (Signed)
Phone call to discuss EMB.  Still bleeding from EMB.  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  Plan will be Provera 10 mg daily for 3 - 6 months.  I discussed side effects and alternatives.  Will use condoms for birth control.   Plan for EMB in 3 months.

## 2013-02-18 ENCOUNTER — Telehealth: Payer: Self-pay | Admitting: Obstetrics and Gynecology

## 2013-02-18 NOTE — Telephone Encounter (Signed)
LMTCB to schedule 3 month repeat bx with Dr. Edward Jolly.

## 2013-02-20 ENCOUNTER — Ambulatory Visit (INDEPENDENT_AMBULATORY_CARE_PROVIDER_SITE_OTHER): Payer: BC Managed Care – PPO | Admitting: Surgery

## 2013-02-26 ENCOUNTER — Telehealth: Payer: Self-pay | Admitting: Obstetrics and Gynecology

## 2013-02-26 NOTE — Telephone Encounter (Signed)
LMTCB to scheduled 3 month repeat biopsy.

## 2013-02-26 NOTE — Telephone Encounter (Signed)
error 

## 2013-02-27 ENCOUNTER — Ambulatory Visit (INDEPENDENT_AMBULATORY_CARE_PROVIDER_SITE_OTHER): Payer: BC Managed Care – PPO | Admitting: Surgery

## 2013-03-03 NOTE — Telephone Encounter (Signed)
Dr. Edward Jolly,  Spoke with patient. LMP: 02/06/13 then biopsy 02/12/13.  She states that she has been taking Provera and stopped bleeding on 9/19. Has not had any bleeding since 9/19 then started with heavy period (changing pad q 2-3 hours) on 10/1. She has been taking all pills, on first week of taking them had trouble with timing but she has been taking on time.   Spoke with Dr. Edward Jolly,  Patient should keep log of menses and call if bleeding worsens.  Spoke with patient she is agreeable to plan and was able to verbalize when to call for need for follow up.   Routing to provider for signature.    Carolynn, I scheduled patient for biopsy in December. Do you need anything further?

## 2013-03-03 NOTE — Telephone Encounter (Signed)
Patient returned Carolynn's call.  Also, patient wants to speak with nurse re: taking Provera and still having bleeding and cramping for the last 5 days. Please advise?

## 2013-03-04 NOTE — Telephone Encounter (Signed)
Everything looks great. Thanks for your help French Ana!

## 2013-03-10 ENCOUNTER — Telehealth: Payer: Self-pay | Admitting: Obstetrics and Gynecology

## 2013-03-10 NOTE — Telephone Encounter (Signed)
Spoke with patient. She will increase provera to 20 mg daily.  She declines appointment for tomorrow at 2:30 due to work. Scheduled for OV at 0845 on 10/16. Patient is agreeable. Advised patient if bleeding worsens or if she feels dizzy or any other concerns, should go to nearest ER or be seen for OV tomorrow and we can give note for work. Patient agreeable and verbalized understanding of instructions.

## 2013-03-10 NOTE — Telephone Encounter (Signed)
Message left to return call to Kristy Stafford at [phone].    

## 2013-03-10 NOTE — Telephone Encounter (Signed)
Patient has bleeding heavily for two weeks (changing pad every 30 minutes) and is requesting advice from nurse.

## 2013-03-10 NOTE — Telephone Encounter (Addendum)
Kristy Stafford is a 37 y.o. female  Currently on Provera, started 9/22. States that she has been bleeding for two weeks, but for the last 3 days, bleeding/cramping has been increasing. Changing pads/tampons q 30 minutes. Patient states that she knew to call earlier if bleeding increased but she is working and has not been able to call. Office visit offered today and patient declines, states she is working until midnight tonight and cannot come in. Advised patient that increased bleeding necessitates an appointment, verbalizes understanding but cannot call off of work. States she can only come in for morning appointment. Patient states she has not missed any provera tablets. Patient denies any increased fatigue.  Advised will discuss with provider and call back. Patient agreeable and okay to leave detailed message on voicemail.   Will route to Dr. Edward Jolly for disposition, Dr. Edward Jolly, is there a time in the morning you could see her on 10/15?

## 2013-03-10 NOTE — Telephone Encounter (Signed)
Have patient increase her Provera to 20 mg daily.  Office visit tomorrow with me at 2:30.

## 2013-03-12 ENCOUNTER — Ambulatory Visit (INDEPENDENT_AMBULATORY_CARE_PROVIDER_SITE_OTHER): Payer: BC Managed Care – PPO | Admitting: Obstetrics and Gynecology

## 2013-03-12 ENCOUNTER — Ambulatory Visit (INDEPENDENT_AMBULATORY_CARE_PROVIDER_SITE_OTHER): Payer: BC Managed Care – PPO

## 2013-03-12 ENCOUNTER — Encounter: Payer: Self-pay | Admitting: Obstetrics and Gynecology

## 2013-03-12 VITALS — BP 122/78 | HR 70 | Ht 64.0 in | Wt 189.0 lb

## 2013-03-12 DIAGNOSIS — N8501 Benign endometrial hyperplasia: Secondary | ICD-10-CM

## 2013-03-12 DIAGNOSIS — N921 Excessive and frequent menstruation with irregular cycle: Secondary | ICD-10-CM

## 2013-03-12 DIAGNOSIS — N8500 Endometrial hyperplasia, unspecified: Secondary | ICD-10-CM

## 2013-03-12 DIAGNOSIS — N920 Excessive and frequent menstruation with regular cycle: Secondary | ICD-10-CM

## 2013-03-12 LAB — HEMOGLOBIN, FINGERSTICK: Hemoglobin, fingerstick: 11.4 g/dL — ABNORMAL LOW (ref 12.0–16.0)

## 2013-03-12 LAB — POCT URINE PREGNANCY: Preg Test, Ur: NEGATIVE

## 2013-03-12 MED ORDER — MEGESTROL ACETATE 20 MG PO TABS: 20.0000 mg | ORAL_TABLET | Freq: Every day | ORAL | Status: DC

## 2013-03-12 NOTE — Patient Instructions (Signed)
Megestrol tablets What is this medicine? MEGESTROL (me JES trol) belongs to a class of drugs known as progestins. Megestrol tablets are used to treat advanced breast or endometrial cancer. This medicine may be used for other purposes; ask your health care provider or pharmacist if you have questions. What should I tell my health care provider before I take this medicine? They need to know if you have any of these conditions: -adrenal gland problems -history of blood clots of the legs, lungs, or other parts of the body -diabetes -kidney disease -liver disease -stroke -an unusual or allergic reaction to megestrol, other medicines, foods, dyes, or preservatives -pregnant or trying to get pregnant -breast-feeding How should I use this medicine? Take this medicine by mouth. Follow the directions on the prescription label. Do not take your medicine more often than directed. Take your doses at regular intervals. Do not stop taking except on the advice of your doctor or health care professional. Talk to your pediatrician regarding the use of this medicine in children. Special care may be needed. Overdosage: If you think you have taken too much of this medicine contact a poison control center or emergency room at once. NOTE: This medicine is only for you. Do not share this medicine with others. What if I miss a dose? If you miss a dose, take it as soon as you can. If it is almost time for your next dose, take only that dose. Do not take double or extra doses. What may interact with this medicine? Do not take this medicine with any of the following medications: -dofetilide This medicine may also interact with the following medications: -carbamazepine -indinavir -phenobarbital -phenytoin -primidone -rifampin -warfarin This list may not describe all possible interactions. Give your health care provider a list of all the medicines, herbs, non-prescription drugs, or dietary supplements you use.  Also tell them if you smoke, drink alcohol, or use illegal drugs. Some items may interact with your medicine. What should I watch for while using this medicine? Visit your doctor or health care professional for regular checks on your progress. Continue taking this medicine even if you feel better. It may take 2 months of regular use before you know if this medicine is working for your condition. If you are a female of child-bearing age, use an effective method of birth control while you are taking this medicine. This medicine should not be used by females who are pregnant or breast-feeding. There is a potential for serious side effects to an unborn child or to an infant. Talk to your health care professional or pharmacist for more information. If you have diabetes, this medicine may affect blood sugar levels. Check your blood sugar and talk to your doctor or health care professional if you notice changes. What side effects may I notice from receiving this medicine? Side effects that you should report to your doctor or health care professional as soon as possible: -difficulty breathing or shortness of breath -chest pain -dizziness -fluid retention -increased blood pressure -leg pain or swelling -nausea and vomiting -skin rash or itching -weakness Side effects that usually do not require medical attention (report to your doctor or health care professional if they continue or are bothersome): -breakthrough menstrual bleeding -hot flashes or flushing -increased appetite -mood changes -sweating -weight gain This list may not describe all possible side effects. Call your doctor for medical advice about side effects. You may report side effects to FDA at 1-800-FDA-1088. Where should I keep my medicine? Keep out  of the reach of children. Store at controlled room temperature between 15 and 30 degrees C (59 and 86 degrees F). Protect from heat above 40 degrees C (104 degrees F). Throw away any unused  medicine after the expiration date. NOTE: This sheet is a summary. It may not cover all possible information. If you have questions about this medicine, talk to your doctor, pharmacist, or health care provider.  2013, Elsevier/Gold Standard. (12/01/2007 3:57:10 PM)

## 2013-03-12 NOTE — Progress Notes (Signed)
     Addendum  Ultrasound showing thickened endometrium - clot versus mass.  Polycystic appearance of ovaries.    I suspect this is clot as the patient just had an ultrasound and an endometrial biopsy approx. One month ago showing simple endometrial hyperplasia.  UPT negative  Hgb 11.4  Plan  Will switch to Megestrol 20 mg daily for the next one month.  I discussed side effects and warning signs of thromboembolic events.  Then will return to Provera 10 mg daily. Knows that she will need an endometrial biopsy in 2 months. Knows to prevent pregnancy if sexually active.

## 2013-03-12 NOTE — Progress Notes (Signed)
Patient ID: [facility], female   DOB: Nov 28, 1975, 37 y.o.   MRN: [id number]  Subjective  Patient is here for heavy vaginal bleeding.  Has known simple endometrial hyperplasia and oligomenorrhea.  Had ultrasound one month ago with EMB.  Has been bleeding almost ever since then. Pad changes up the every 45 minutes.   No dizziness or lightheadedness. Suprapubic cramping.   Last intercourse end of August.  Patient has been on Provera 10 mg daily since EMB results back.  Was increased to 20 mg daily for the last three days.  Has not taken today's dosage.  Objective  BP - 122/78  NAD.  Pelvic - blood stained perineum. Cervix no lesions. Small amount of clot in vagina. Uterus small and slightly tender. No adnexal masses or tenderness.  Assessment  Menometrorrhagia. Known simple endometrial hyperplasia.   On Provera 2o mg daily.   Plan  UPT Hgb Pelvic ultrasound now.

## 2013-04-08 ENCOUNTER — Telehealth: Payer: Self-pay | Admitting: Obstetrics and Gynecology

## 2013-04-08 NOTE — Telephone Encounter (Signed)
Ok to move appointment to 12/4 or 12/8 and I will do a repeat endometrial biopsy sooner than the end of December.  Thanks!

## 2013-04-08 NOTE — Telephone Encounter (Signed)
Call to patient to reschedule canceled appointment. Patient is barely audible and states she is really sick.  Patient already has a follow-up and endometrial biopsy on 05-18-13.  This was a one month follow-up for menorrhagia and simple hyperplasia on endo biopsy. On 03-12-13, she was switched to Megestrol 20 mg daily for one month then to return to Provera. She is still on Megestrol and has four pills left.  Bleeding continues but if more of a normal menses instead of the "excessive bleeding" If rescheduled for 12-4 or 05-04-13, would we be able to repeat endometrial biopsy? Or does she need to be seen sooner for one month recheck and leave endo biospy on 05-18-13?    Advised will review with Dr Edward Jolly and call her back.

## 2013-04-08 NOTE — Telephone Encounter (Signed)
Patient had to cancel her appt for Friday and needs to reschedule but silva is booked solid. Was told to sent to triage.

## 2013-04-08 NOTE — Telephone Encounter (Signed)
Call to patient and advised of Dr Rica Records response, follow-up and poss endo biopsy scheduled for 05-04-13 at 700am.  Patient works 3rd shift so needed early am. Did not cancel 05-18-13 appt until patient has biopsy completed.

## 2013-04-10 ENCOUNTER — Ambulatory Visit: Payer: BC Managed Care – PPO | Admitting: Obstetrics and Gynecology

## 2013-04-30 ENCOUNTER — Telehealth: Payer: Self-pay | Admitting: Obstetrics and Gynecology

## 2013-04-30 NOTE — Telephone Encounter (Signed)
Routing to Dr. Edward Jolly for advice. Patient was scheduled for follow up appointment on 12/8 and has biopsy scheduled for 12/22.

## 2013-04-30 NOTE — Telephone Encounter (Signed)
Pt says she need to cancel her appt on the 8th with Dr. Edward Jolly and wanted to make sure she could get everything done on the 12/22.

## 2013-05-01 MED ORDER — MEDROXYPROGESTERONE ACETATE 10 MG PO TABS: ORAL_TABLET | ORAL | Status: DC

## 2013-05-01 NOTE — Telephone Encounter (Signed)
Reviewed with Dr Edward Jolly, patient to continue on Provera 20 mg daily and will have follow-up and repeat biopsy at appt 05-18-13. Patient notified and agreeable.  New RX to pharmacy.  Routing to provider for final review. Patient agreeable to disposition. Will close encounter

## 2013-05-01 NOTE — Telephone Encounter (Signed)
Everything will be done on December 22!

## 2013-05-01 NOTE — Telephone Encounter (Signed)
Continue with Provera 20 mg daily.   Patient will need to keep appointment for 05/18/13.

## 2013-05-01 NOTE — Telephone Encounter (Signed)
Patient says she waiting on a return call from a nurse.

## 2013-05-01 NOTE — Telephone Encounter (Signed)
Call to patient and notified that per Dr Edward Jolly, can do everything on 05-18-13.    Patient now reports she is running out of Provera and pharmacy will not refill because it is "too soon"  Patient was on Megestrol 20 mg for one month and then back to Provera but she has continued on Provera 20 mg (Taking two 10 mg tablets) and now not able to get refill Still bleeding on Provera 20mg . Reports "average flow" of 3 tampons per day.  Has only had 1-2 days of no bleeding since last call 03-2013 and that occurred during transition form megestrol to Provera.  Continue on Provera 20mg  or any change?   Will need new RX.  Please advise  Needs answer today as she will run out of Provera this weekend.

## 2013-05-04 ENCOUNTER — Ambulatory Visit: Payer: Self-pay | Admitting: Obstetrics and Gynecology

## 2013-05-18 ENCOUNTER — Ambulatory Visit (INDEPENDENT_AMBULATORY_CARE_PROVIDER_SITE_OTHER): Payer: BC Managed Care – PPO | Admitting: Obstetrics and Gynecology

## 2013-05-18 ENCOUNTER — Ambulatory Visit: Payer: Self-pay | Admitting: Obstetrics and Gynecology

## 2013-05-18 ENCOUNTER — Other Ambulatory Visit: Payer: Self-pay

## 2013-05-18 ENCOUNTER — Encounter: Payer: Self-pay | Admitting: Obstetrics and Gynecology

## 2013-05-18 VITALS — BP 110/72 | HR 84 | Resp 18 | Ht 64.0 in | Wt 184.0 lb

## 2013-05-18 DIAGNOSIS — N915 Oligomenorrhea, unspecified: Secondary | ICD-10-CM

## 2013-05-18 DIAGNOSIS — N8501 Benign endometrial hyperplasia: Secondary | ICD-10-CM

## 2013-05-18 DIAGNOSIS — N8500 Endometrial hyperplasia, unspecified: Secondary | ICD-10-CM

## 2013-05-18 NOTE — Patient Instructions (Signed)

## 2013-05-18 NOTE — Progress Notes (Signed)
Patient ID: [facility], female   DOB: 1975-10-22, 37 y.o.   MRN: [id number]  Subjective  Patient is here for a follow up endometrial biopsy. Long history of oligomenorrhea. EMB on 02/12/13 showed simple hyperplasia. Patient was treated with Provera 10 mg daily but had daily bleeding. Increased Provera to 20 mg daily.  Still continued with bleeding so started Megace 20 mg daily for one month. Has been back on Provera 20 mg daily. Still having bleeding. Cramping.   Still smoking occasionally.  Feeling cold and sluggish.   Not sexually active since August. PT 03/12/13 negative.  Has FMLA papers to have our office fill out but did not bring it today.  States she has had heavy bleeding and clotting affective her ability to work. Has called in to the office to document some of the bleeding episodes.  Objective  BP 110/72    P 84  EMB  Consent obtained.  Speculum placed. Sterile prep of cervix with Hibiclens. Tenaculum to anterior cervical lip. Pipelle passed to 8 cm - mostly clot obtained. [place]ak EMB passed to 8 cm - small amount of tissue obtained. All specimen to pathology. No complications. Minimal EBL.  Assessment  Simple endometrial hyperplasia.    Plan  Follow up EMB. May consider switch to progesterone only OCPs if biopsy is normal.  Patient will return to drop off FMLA papers and will do Hgb at that time.  Patient given Coke after procedure.

## 2013-05-20 ENCOUNTER — Other Ambulatory Visit: Payer: BC Managed Care – PPO

## 2013-05-25 ENCOUNTER — Other Ambulatory Visit (INDEPENDENT_AMBULATORY_CARE_PROVIDER_SITE_OTHER): Payer: BC Managed Care – PPO

## 2013-05-25 ENCOUNTER — Telehealth: Payer: Self-pay | Admitting: Obstetrics and Gynecology

## 2013-05-25 DIAGNOSIS — N8501 Benign endometrial hyperplasia: Secondary | ICD-10-CM

## 2013-05-25 DIAGNOSIS — N915 Oligomenorrhea, unspecified: Secondary | ICD-10-CM

## 2013-05-25 DIAGNOSIS — N8500 Endometrial hyperplasia, unspecified: Secondary | ICD-10-CM

## 2013-05-25 LAB — CBC
HCT: 35.7 % — ABNORMAL LOW (ref 36.0–46.0)
Hemoglobin: 11.5 g/dL — ABNORMAL LOW (ref 12.0–15.0)
MCH: 24 pg — ABNORMAL LOW (ref 26.0–34.0)
MCHC: 32.2 g/dL (ref 30.0–36.0)
MCV: 74.4 fL — ABNORMAL LOW (ref 78.0–100.0)
Platelets: 346 10*3/uL (ref 150–400)

## 2013-05-25 NOTE — Telephone Encounter (Signed)
Phone call to discuss endometrial biopsy and Hgb.  No answer, so I left a message that I called to discuss results and give "good news."  EMB showing progestational effect.  No hyperplasia, atypia, or malignancy.  Hgb 11.6.  I would recommend transitioning to monthly provera 10 mg daily for 10 days or to ortho micronor birth control pills.

## 2013-05-26 MED ORDER — NORETHINDRONE 0.35 MG PO TABS: 1.0000 | ORAL_TABLET | Freq: Every day | ORAL | Status: DC

## 2013-05-26 NOTE — Telephone Encounter (Signed)
Phone call to patient to again discuss test result. EMB showing no hyperplasia. I discussed progesterone options including cyclic provera, OrthoMicronor, depo provera, Mirena and Skyla. Patient chooses OrthoMicronor.   I discussed medication.  She will stop Provera for two weeks and then start the OrthoMicronor.

## 2013-06-08 ENCOUNTER — Telehealth: Payer: Self-pay | Admitting: *Deleted

## 2013-06-08 NOTE — Telephone Encounter (Signed)
Call to patient regarding her FMLA forms that she dropped off.  Patient  reports she had to leave work on 06-03-13 due to soiling of clothes and was out the next three days. Returned on 06-07-13.   Advised we will need new FMLA forms to complete since she has filled in the physician portion of the forms.   Advised these forms can take up to two weeks for completion. She states she has to turn them in within 17 days of absence or will get points and that since she has missed so many days due to this heavy bleeding that she can not acquire any additional ponits.   Advised that we will attempt to complete these more quickly for her.   Also advised that we will not be able to complete the forms for an open period of time.  She will have to call for assessment each time forms are to be completed and that FMLA is only for greater than three days. She will bring new forms for completion.

## 2013-06-16 NOTE — Telephone Encounter (Signed)
Reviewed with Dr Quincy Simmonds and ok to complete forms.  Patient did bring in new forms to be completed (first ones were completed by patient and just needed MD signature).  LM on VM (number confirmation) that forms were completed and faxed.  LM for patient to confirm that forms were received and then to call us to confirm and make payment for FMLA completion.  Routing to provider for final review. Patient agreeable to disposition. Will close encounter

## 2013-06-23 ENCOUNTER — Other Ambulatory Visit: Payer: Self-pay | Admitting: Orthopedic Surgery

## 2013-06-23 ENCOUNTER — Telehealth: Payer: Self-pay | Admitting: Obstetrics and Gynecology

## 2013-06-23 NOTE — Telephone Encounter (Signed)
Says she never received results of her iron test from a few weeks ago and would like to know if the bc pills Dr Quincy Simmonds prescribed for her is an ongoing rx.

## 2013-06-23 NOTE — Telephone Encounter (Signed)
Refaxed forms on Kristy Stafford's desk to handwritten fax number on forms.

## 2013-06-23 NOTE — Telephone Encounter (Signed)
Spoke with patient. Advised forms were faxed to number on forms on 06/16/13. I faxed to handwritten number today. Patient wants to pick up copies on 1/28.   Photocopied originals to front Desk with Greta for patient to sign release and pick up. Originals back to [employer].  Advised Hgb on 12/29 11.6 so has not dropped from last check on 10/16. Advised has refills of ortho micronor per Amy's note.  Should patient continue with ocp?

## 2013-06-23 NOTE — Telephone Encounter (Signed)
LMTCB   (Need to tell her HGB was 11.6. She has 2 refills of Ortho Micronor at [facility]. Pt needs Aex appt with BS. First available 07-27-13.)

## 2013-06-23 NOTE — Telephone Encounter (Signed)
Pt returning sally's call regarding paperwork. Pt says she paid for paperwork when she dropped off. Would also like to get a copy of the confirmation that the paperwork was faxed and received.

## 2013-06-24 MED ORDER — NORETHINDRONE 0.35 MG PO TABS: 1.0000 | ORAL_TABLET | Freq: Every day | ORAL | Status: DC

## 2013-06-24 NOTE — Telephone Encounter (Signed)
Yes she should definitely continue taking OCPs.  I made sure RX was completed.

## 2013-06-25 NOTE — Telephone Encounter (Signed)
LM on VM (has number confirmation) that paperwork had been refaxed and was ready for pickup and that yes, she should continue medication.  Call back if questions.

## 2013-07-08 ENCOUNTER — Telehealth: Payer: Self-pay | Admitting: Obstetrics and Gynecology

## 2013-07-08 NOTE — Telephone Encounter (Signed)
Phone call to patient to discuss bleeding  Started Kristy Stafford beginning of January 2015. Taking every pill on time.  Bleeding started on 06/22/13, the fourth week of the pack.  This has continued to date. Bleeding not a lot like she did in the recent past. Feels frustrated with continued bleeding.  Patient would like option for future fertility, but is not pursuing this at this time.   Smoker.  I recommend continuing on the [place] birth control for another 6 weeks.    If bleeding does not normalize, I would recommend a hysteroscopy with dilation and curettage.  I have shared this with the patient in this conversation.    Patient will come in for recheck in 6 weeks.

## 2013-07-08 NOTE — Telephone Encounter (Signed)
Spoke with patient. She states she has been bleeding irregularly since 06/22/13. She states that it is "not bad, just annoying." she had period on correct part of inactive pills, but has not stopped bleeding. Had one week of "regular period", 2nd week of active pills had light bleeding that she could wear a light tampon and now is on 3rd week of bleeding with active pills and feels that she has started another cycle. Changing pad q 3-4 hours. Denies any pain, but states intermittent cramping. States there is no chance for pregnancy at this time. Declines office visit. Discuss that bleeding on progesterone only pills may be irregular for the first 3 months of taking pills. Advised patient that I would send a message to Dr. Quincy Simmonds to obtain any further advice, patient is frustrated with multiple office visits and continued bleeding.

## 2013-07-08 NOTE — Telephone Encounter (Signed)
Patient is having abnormal bleeding for 2 1/2 weeks.

## 2013-07-22 ENCOUNTER — Ambulatory Visit: Payer: BC Managed Care – PPO | Admitting: Neurology

## 2013-09-02 ENCOUNTER — Ambulatory Visit (INDEPENDENT_AMBULATORY_CARE_PROVIDER_SITE_OTHER): Payer: BC Managed Care – PPO | Admitting: Neurology

## 2013-09-02 ENCOUNTER — Encounter: Payer: Self-pay | Admitting: Neurology

## 2013-09-02 ENCOUNTER — Encounter (INDEPENDENT_AMBULATORY_CARE_PROVIDER_SITE_OTHER): Payer: Self-pay

## 2013-09-02 VITALS — BP 111/78 | HR 76 | Ht 64.75 in | Wt 188.0 lb

## 2013-09-02 DIAGNOSIS — R202 Paresthesia of skin: Secondary | ICD-10-CM

## 2013-09-02 DIAGNOSIS — R531 Weakness: Secondary | ICD-10-CM

## 2013-09-02 DIAGNOSIS — R209 Unspecified disturbances of skin sensation: Secondary | ICD-10-CM

## 2013-09-02 DIAGNOSIS — R5381 Other malaise: Secondary | ICD-10-CM

## 2013-09-02 DIAGNOSIS — R5383 Other fatigue: Secondary | ICD-10-CM

## 2013-09-02 NOTE — Patient Instructions (Signed)
Overall you are doing fairly well but I do want to suggest a few things today:   As far as diagnostic testing:  1) I would like you to have a ultrasound of your extremity to check for a vascular cause of your symptoms 2)We will consider a MRI of the cervical spine depending on what the ultrasound shows  Please call us with any interim questions, concerns, problems, updates or refill requests.   Please also call us for any test results so we can go over those with you on the phone.  My clinical assistant and will answer any of your questions and relay your messages to me and also relay most of my messages to you.   Our phone number is [phone]. We also have an after hours call service for urgent matters and there is a physician on-call for urgent questions. For any emergencies you know to call 911 or go to the nearest emergency room

## 2013-09-02 NOTE — Progress Notes (Signed)
[facility]    Provider:  Dr Janann Colonel Referring Provider: Lucianne Lei, MD Primary Care Physician:  Elyn Peers, MD  CC:  Paresthesias in bilateral upper extremities  HPI:  Kristy Stafford is a 38 y.o. female here as a referral from Dr. Criss Rosales for question of neurogenic thoracic outlet syndrome  Symptoms began around 1 year ago, states she initially noted difficulty using her hands bilaterally, impaired fine motor skills and lack of sensation. Felt like her hands were asleep for a few days. At night her whole arm goes numb and she is unable to move it. Notes she now has constant weakness and impaired sensation in her hands. Feels it is getting progressively worse. Notes that in the past 3 to 4 months she is noticing some pins and needles sensation in her bilateral legs. Denies any weakness. No change in bowel/bladder function.   Had a EMG/NCS which she reports was normal per the patient.  Had a X-ray of her cervical region which was unremarkable. Did physical therapy in the past which worked on stretching. Instructed to follow up with our office.   Review of Systems: Out of a complete 14 system review, the patient complains of only the following symptoms, and all other reviewed systems are negative. +numbness, weakness, insomnia, shift work  History   Social History  . Marital Status: Married    Spouse Name: N/A    Number of Children: N/A  . Years of Education: N/A   Occupational History  . Not on file.   Social History Main Topics  . Smoking status: Current Some Day Smoker    Types: Cigars  . Smokeless tobacco: Never Used     Comment: 1 black and mild cigar per day  . Alcohol Use: Yes     Comment: occ  . Drug Use: No  . Sexual Activity: Not Currently    Partners: Male   Other Topics Concern  . Not on file   Social History Narrative   Single, no children   Right handed   Some college   < 1 cup daily    Family History  Problem Relation Age of Onset   . Cancer Mother 39    breast  . Breast cancer Mother   . Diabetes Father   . Cancer Maternal Grandmother     breast--dec age 16  . Breast cancer Maternal Grandmother   . Ovarian cancer Maternal Aunt     deceased    Past Medical History  Diagnosis Date  . Hyperlipidemia   . Nipple discharge     left breast  . Sleep apnea     has a cpap for 3 yr-told she will need to bring day of surgery and use post op  . Medical history non-contributory   . Breast mass, left 10/10/2012    Excised 11/24/12, papilloma on path   . Depression   . Anemia     Past Surgical History  Procedure Laterality Date  . No past surgeries    . Breast ductal system excision Left 11/24/2012    Procedure: EXCISION DUCTAL SYSTEM BREAST;  Surgeon: Haywood Lasso, MD;  Location: [facility];  Service: General;  Laterality: Left;    Current Outpatient Prescriptions  Medication Sig Dispense Refill  . norethindrone (MICRONOR,CAMILA,ERRIN) 0.35 MG tablet Take 1 tablet (0.35 mg total) by mouth daily.  1 Package  10   No current facility-administered medications for this visit.    Allergies as of 09/02/2013  . (  No Known Allergies)    Vitals: BP 111/78  Pulse 76  Ht 5' 4.75" (1.645 m)  Wt 188 lb (85.276 kg)  BMI 31.51 kg/m2 Last Weight:  Wt Readings from Last 1 Encounters:  09/02/13 188 lb (85.276 kg)   Last Height:   Ht Readings from Last 1 Encounters:  09/02/13 5' 4.75" (1.645 m)     Physical exam: Exam: Gen: NAD, conversant Eyes: anicteric sclerae, moist conjunctivae HENT: Atraumatic, oropharynx clear Neck: Trachea midline; supple,  Lungs: CTA, no wheezing, rales, rhonic                          CV: RRR, no MRG Abdomen: Soft, non-tender;  Extremities: No peripheral edema  Skin: Normal temperature, no rash,  Psych: Appropriate affect, pleasant  Neuro: MS: AA&Ox3, appropriately interactive, normal affect   Speech: fluent w/o paraphasic error  Memory: good recent and  remote recall  CN: PERRL, EOMI no nystagmus, no ptosis, sensation intact to LT V1-V3 bilat, face symmetric, no weakness, hearing grossly intact, palate elevates symmetrically, shoulder shrug 5/5 bilat,  tongue protrudes midline, no fasiculations noted.  Motor: normal bulk and tone Strength: 5/5  In all extremities  Coord: rapid alternating and point-to-point (FNF, HTS) movements intact.  Reflexes: symmetrical, bilat downgoing toes  Sens: LT intact in all extremities  Gait: posture, stance, stride and arm-swing normal. Tandem gait intact. Able to walk on heels and toes. Romberg absent.   Assessment:  After physical and neurologic examination, review of laboratory studies, imaging, neurophysiology testing and pre-existing records, assessment will be reviewed on the problem list.  Plan:  Treatment plan and additional workup will be reviewed under Problem List.  1)Upper extremity paresthesias 2)Weakness  37y/o woman presenting for initial evaluation of bilateral upper extremity paresthesias and weakness. Has been evaluated by orthopaedic surgery in the past with unremarkable workup, including EMG/NCS and X-rays. Unclear etiology of symptoms. Differential would include thoracic outlet syndrome vs possible cervical radiculopathy. Will order ultrasound of upper extremity to rule out vascular TOS. Will consider MRI C spine. Follow up once U/S completed.   Jim Like, DO  [facility] [address] [place], [place] [address]  Phone [phone] Fax [phone]

## 2013-09-10 ENCOUNTER — Telehealth: Payer: Self-pay | Admitting: Orthopedic Surgery

## 2013-09-10 NOTE — Telephone Encounter (Signed)
Attempted call to pt, cell just rings and rings. No answer. Will try again later.   (Need to check on pt and bleeding status. Needs to schedule Aex with BS.)

## 2013-09-18 ENCOUNTER — Telehealth: Payer: Self-pay | Admitting: Neurology

## 2013-09-18 NOTE — Telephone Encounter (Signed)
Patient calling to check on the status of her FMLA paperwork, please return call to patient and advise.

## 2013-09-18 NOTE — Telephone Encounter (Signed)
Pt calling inquiring about FMLA papers. Please advise

## 2013-09-22 ENCOUNTER — Telehealth: Payer: Self-pay | Admitting: *Deleted

## 2013-09-22 NOTE — Telephone Encounter (Signed)
Spoke with patient, informed her that forms are ready for pick up at the front lobby.

## 2013-10-07 NOTE — Telephone Encounter (Signed)
Form has been completed and patient has received.

## 2014-03-29 ENCOUNTER — Encounter: Payer: Self-pay | Admitting: Neurology

## 2014-07-16 ENCOUNTER — Ambulatory Visit: Payer: Self-pay | Admitting: Neurology

## 2014-07-20 ENCOUNTER — Ambulatory Visit: Payer: Self-pay | Admitting: Neurology

## 2014-07-21 ENCOUNTER — Encounter: Payer: Self-pay | Admitting: Neurology

## 2016-01-11 ENCOUNTER — Emergency Department (HOSPITAL_BASED_OUTPATIENT_CLINIC_OR_DEPARTMENT_OTHER): Payer: Managed Care, Other (non HMO)

## 2016-01-11 ENCOUNTER — Encounter (HOSPITAL_BASED_OUTPATIENT_CLINIC_OR_DEPARTMENT_OTHER): Payer: Self-pay

## 2016-01-11 ENCOUNTER — Emergency Department (HOSPITAL_BASED_OUTPATIENT_CLINIC_OR_DEPARTMENT_OTHER)
Admission: EM | Admit: 2016-01-11 | Discharge: 2016-01-11 | Disposition: A | Discharge status: Home / Self Care | Payer: Managed Care, Other (non HMO) | Attending: Emergency Medicine | Admitting: Emergency Medicine

## 2016-01-11 DIAGNOSIS — F17210 Nicotine dependence, cigarettes, uncomplicated: Secondary | ICD-10-CM | POA: Insufficient documentation

## 2016-01-11 DIAGNOSIS — R079 Chest pain, unspecified: Secondary | ICD-10-CM | POA: Diagnosis not present

## 2016-01-11 LAB — COMPREHENSIVE METABOLIC PANEL
ALBUMIN: 4 g/dL (ref 3.5–5.0)
ALT: 16 U/L (ref 14–54)
ANION GAP: 7 (ref 5–15)
AST: 16 U/L (ref 15–41)
Alkaline Phosphatase: 59 U/L (ref 38–126)
BUN: 11 mg/dL (ref 6–20)
CHLORIDE: 105 mmol/L (ref 101–111)
CO2: 25 mmol/L (ref 22–32)
Calcium: 9.1 mg/dL (ref 8.9–10.3)
Creatinine, Ser: 0.62 mg/dL (ref 0.44–1.00)
GFR calc Af Amer: >60 mL/min (ref >60)
GFR calc non Af Amer: >60 mL/min (ref >60)
GLUCOSE: 98 mg/dL (ref 65–99)
POTASSIUM: 4 mmol/L (ref 3.5–5.1)
SODIUM: 137 mmol/L (ref 135–145)
Total Bilirubin: 0.5 mg/dL (ref 0.3–1.2)
Total Protein: 7.5 g/dL (ref 6.5–8.1)

## 2016-01-11 LAB — CBC WITH DIFFERENTIAL/PLATELET
Basophils Absolute: 0 10*3/uL (ref 0.0–0.1)
Basophils Relative: 0 %
Eosinophils Absolute: 0.1 10*3/uL (ref 0.0–0.7)
Eosinophils Relative: 2 %
HEMATOCRIT: 38.4 % (ref 36.0–46.0)
HEMOGLOBIN: 12.5 g/dL (ref 12.0–15.0)
LYMPHS ABS: 2.5 10*3/uL (ref 0.7–4.0)
Lymphocytes Relative: 36 %
MCH: 26.8 pg (ref 26.0–34.0)
MCHC: 32.6 g/dL (ref 30.0–36.0)
MCV: 82.4 fL (ref 78.0–100.0)
MONOS PCT: 9 %
Monocytes Absolute: 0.6 10*3/uL (ref 0.1–1.0)
NEUTROS ABS: 3.7 10*3/uL (ref 1.7–7.7)
NEUTROS PCT: 53 %
Platelets: 262 10*3/uL (ref 150–400)
RBC: 4.66 MIL/uL (ref 3.87–5.11)
RDW: 13.6 % (ref 11.5–15.5)
WBC: 6.9 10*3/uL (ref 4.0–10.5)

## 2016-01-11 LAB — D-DIMER, QUANTITATIVE (NOT AT ARMC)

## 2016-01-11 LAB — TROPONIN I
Troponin I: <0.03 ng/mL (ref <0.03)
Troponin I: <0.03 ng/mL (ref <0.03)

## 2016-01-11 MED ORDER — ASPIRIN 81 MG PO CHEW
324.0000 mg | CHEWABLE_TABLET | Freq: Once | ORAL | Status: AC
  Administered 2016-01-11: 324 mg via ORAL
  Filled 2016-01-11: qty 4

## 2016-01-11 NOTE — ED Notes (Signed)
Patient transported to X-ray 

## 2016-01-11 NOTE — ED Provider Notes (Signed)
[facility]PT [facility] Provider Note   CSN: [id number] Arrival date & time: 01/11/16  1337     History   Chief Complaint Chief Complaint  Patient presents with  . Chest Pain    HPI Kristy Stafford is a 40 y.o. female.  HPI   Chest pain, sharp began 1 hour prior to arrival at work, shooting pain down the left arm, left arm heaviness.  Better with relaxation/deep breathing. Not exertional.  No shortness, no nausea, no diaphoresis, no cough or fever. Episodes, first lasted 18min, then short episodes after that. Currently no pain  Hyperlipidemia, occasional cigarettes with stress  Came back from [place] 1 week ago.  Did have diarrhea while in [place].  High stress job. Flew back 1 week ago.   Seen hand and arm doctor for nerves in hands, for a few years     Past Medical History:  Diagnosis Date  . Anemia   . Breast mass, left 10/10/2012   Excised 11/24/12, papilloma on path   . Depression   . Hyperlipidemia   . Medical history non-contributory   . Nipple discharge    left breast  . Sleep apnea    has a cpap for 3 yr-told she will need to bring day of surgery and use post op    Patient Active Problem List   Diagnosis Date Noted  . 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 BREAST DUCTAL SYSTEM EXCISION Left 11/24/2012   Procedure: EXCISION DUCTAL SYSTEM BREAST;  Surgeon: Haywood Lasso, MD;  Location: [facility];  Service: General;  Laterality: Left;  . NO PAST SURGERIES      OB History    Gravida Para Term Preterm AB Living   3       3     SAB TAB Ectopic Multiple Live Births   3               Home Medications    Prior to Admission medications   Not on File    Family History Family History  Problem Relation Age of Onset  . Cancer Mother 67    breast  . Breast cancer Mother   . Diabetes Father   . Cancer Maternal Grandmother     breast--dec age 62  . Breast cancer Maternal  Grandmother   . Ovarian cancer Maternal Aunt     deceased    Social History Social History  Substance Use Topics  . Smoking status: Current Some Day Smoker    Types: Cigars  . Smokeless tobacco: Never Used     Comment: 1 black and mild cigar per day  . Alcohol use Yes     Comment: occ     Allergies   Review of patient's allergies indicates no known allergies.   Review of Systems Review of Systems  Constitutional: Negative for fever.  HENT: Negative for sore throat.   Eyes: Negative for visual disturbance.  Respiratory: Negative for cough and shortness of breath.   Cardiovascular: Positive for chest pain.  Gastrointestinal: Negative for abdominal pain, nausea and vomiting.  Genitourinary: Negative for difficulty urinating.  Musculoskeletal: Positive for arthralgias. Negative for back pain and neck pain.  Skin: Negative for rash.  Neurological: Negative for syncope and headaches.     Physical Exam Updated Vital Signs BP 109/86   Pulse 73   Temp 99.1 F (37.3 C) (Oral)   Resp 23   Ht 5\' 3"  (1.6  m)   Wt 190 lb (86.2 kg)   LMP 01/02/2016   SpO2 100%   BMI 33.66 kg/m   Physical Exam  Constitutional: She is oriented to person, place, and time. She appears well-developed and well-nourished. No distress.  HENT:  Head: Normocephalic and atraumatic.  Eyes: Conjunctivae and EOM are normal.  Neck: Normal range of motion.  Cardiovascular: Normal rate, regular rhythm, normal heart sounds and intact distal pulses.  Exam reveals no gallop and no friction rub.   No murmur heard. Pulmonary/Chest: Effort normal and breath sounds normal. No respiratory distress. She has no wheezes. She has no rales. She exhibits tenderness (left lateral chest).  Abdominal: Soft. She exhibits no distension. There is no tenderness. There is no guarding.  Musculoskeletal: She exhibits tenderness (left shoulder). She exhibits no edema.  Neurological: She is alert and oriented to person, place, and  time. She has normal strength. She displays no tremor. No cranial nerve deficit (normal EOM, symmetric pallate, tongue midline, normal sensation face)) or sensory deficit. Coordination normal. GCS eye subscore is 4. GCS verbal subscore is 5. GCS motor subscore is 6.  No pronator drift   Skin: Skin is warm and dry. No rash noted. She is not diaphoretic. No erythema.  Nursing note and vitals reviewed.    ED Treatments / Results  Labs (all labs ordered are listed, but only abnormal results are displayed) Labs Reviewed  COMPREHENSIVE METABOLIC PANEL  TROPONIN I  CBC WITH DIFFERENTIAL/PLATELET  D-DIMER, QUANTITATIVE (NOT AT [facility])  TROPONIN I  TROPONIN I    EKG  EKG Interpretation  Date/Time:  Wednesday January 11 2016 13:50:20 EDT Ventricular Rate:  77 PR Interval:  188 QRS Duration: 84 QT Interval:  370 QTC Calculation: 418 R Axis:   59 Text Interpretation:  Normal sinus rhythm Normal ECG No previous ECGs available Confirmed by [facility] MD, [place] ([id number]) on 01/11/2016 1:52:58 PM       Radiology Dg Chest 2 View  Result Date: 01/11/2016 CLINICAL DATA:  Chest pain for 1 day EXAM: CHEST  2 VIEW COMPARISON:  None. FINDINGS: Lungs are clear. Heart size and pulmonary vascularity are normal. No adenopathy. No bone lesions. No pneumothorax. IMPRESSION: No edema or consolidation. Electronically Signed   By: Lowella Grip III M.D.   On: 01/11/2016 14:22    Procedures Procedures (including critical care time)  Medications Ordered in ED Medications  aspirin chewable tablet 324 mg (324 mg Oral Given 01/11/16 1419)     Initial Impression / Assessment and Plan / ED Course  I have reviewed the triage vital signs and the nursing notes.  Pertinent labs & imaging results that were available during my care of the patient were reviewed by me and considered in my medical decision making (see chart for details).  Clinical Course   40 year old female with history of hyperlipidemia,  smoking, depression, recent travel to [place] presents with concern for left-sided chest pain, with radiation to the left arm. EKG was invited by me and showed no acute changes. Patient with recent travel, and d-dimer was done which was within normal limits, and patient is low risk Wells, and have low suspicion for pulmonary embolus. EKG shows no signs of pericarditis. Initial troponin negative.  HEAR score 2.  Pt with recent travel to [place], however given no sign of myopericarditis/HF, doubt [place] heart disease.  Pt with good bilateral pulses, no mediastinal widening, feel hx not consistent with dissection.  Patient reports a lot of stress at work which may  also contribute to symptoms. She is chest pain free in ED.  Patient with delta troponin pending at time of transfer of care.  If negative, will plan on discharge for outpatient follow up with PCP within one week.   Final Clinical Impressions(s) / ED Diagnoses   Final diagnoses:  Chest pain, unspecified chest pain type    New Prescriptions New Prescriptions   No medications on file     Gareth Morgan, MD 01/11/16 1755

## 2016-01-11 NOTE — ED Notes (Signed)
MD at bedside. 

## 2016-01-11 NOTE — ED Triage Notes (Signed)
CP x 1 hour-NAD-steady gait

## 2017-06-28 DIAGNOSIS — G4730 Sleep apnea, unspecified: Secondary | ICD-10-CM | POA: Diagnosis not present

## 2017-07-24 DIAGNOSIS — G4734 Idiopathic sleep related nonobstructive alveolar hypoventilation: Secondary | ICD-10-CM | POA: Diagnosis not present

## 2017-07-24 DIAGNOSIS — G4739 Other sleep apnea: Secondary | ICD-10-CM | POA: Diagnosis not present

## 2017-07-24 DIAGNOSIS — E28319 Asymptomatic premature menopause: Secondary | ICD-10-CM | POA: Diagnosis not present

## 2017-07-26 DIAGNOSIS — G4739 Other sleep apnea: Secondary | ICD-10-CM | POA: Diagnosis not present

## 2017-07-26 DIAGNOSIS — E638 Other specified nutritional deficiencies: Secondary | ICD-10-CM | POA: Diagnosis not present

## 2017-07-26 DIAGNOSIS — E28319 Asymptomatic premature menopause: Secondary | ICD-10-CM | POA: Diagnosis not present

## 2017-09-24 ENCOUNTER — Encounter (HOSPITAL_BASED_OUTPATIENT_CLINIC_OR_DEPARTMENT_OTHER): Payer: Self-pay

## 2017-09-24 DIAGNOSIS — G4733 Obstructive sleep apnea (adult) (pediatric): Secondary | ICD-10-CM

## 2017-09-29 DIAGNOSIS — H9201 Otalgia, right ear: Secondary | ICD-10-CM | POA: Diagnosis not present

## 2017-09-29 DIAGNOSIS — H6121 Impacted cerumen, right ear: Secondary | ICD-10-CM | POA: Diagnosis not present

## 2017-10-02 ENCOUNTER — Other Ambulatory Visit: Payer: Self-pay | Admitting: Family Medicine

## 2017-10-02 ENCOUNTER — Ambulatory Visit
Admission: RE | Admit: 2017-10-02 | Discharge: 2017-10-02 | Disposition: A | Discharge status: Home / Self Care | Payer: Managed Care, Other (non HMO) | Source: Ambulatory Visit | Attending: Family Medicine | Admitting: Family Medicine

## 2017-10-02 DIAGNOSIS — H6011 Cellulitis of right external ear: Secondary | ICD-10-CM

## 2017-10-02 DIAGNOSIS — R220 Localized swelling, mass and lump, head: Secondary | ICD-10-CM | POA: Diagnosis not present

## 2017-10-05 DIAGNOSIS — H6011 Cellulitis of right external ear: Secondary | ICD-10-CM | POA: Diagnosis not present

## 2017-10-18 ENCOUNTER — Ambulatory Visit (HOSPITAL_BASED_OUTPATIENT_CLINIC_OR_DEPARTMENT_OTHER): Payer: 59

## 2017-10-18 ENCOUNTER — Ambulatory Visit (HOSPITAL_BASED_OUTPATIENT_CLINIC_OR_DEPARTMENT_OTHER): Payer: 59 | Admitting: Internal Medicine

## 2017-11-19 ENCOUNTER — Ambulatory Visit (HOSPITAL_BASED_OUTPATIENT_CLINIC_OR_DEPARTMENT_OTHER): Payer: 59 | Attending: Family Medicine | Admitting: Internal Medicine

## 2017-11-19 DIAGNOSIS — G4733 Obstructive sleep apnea (adult) (pediatric): Secondary | ICD-10-CM

## 2017-11-19 DIAGNOSIS — R0681 Apnea, not elsewhere classified: Secondary | ICD-10-CM | POA: Diagnosis not present

## 2017-11-24 DIAGNOSIS — G4733 Obstructive sleep apnea (adult) (pediatric): Secondary | ICD-10-CM | POA: Diagnosis not present

## 2017-11-24 NOTE — Procedures (Signed)
   Patient Name: [place], Carmesha [place] Date: 11/20/2017 Gender: Female D.O.B: 29-Jul-1975 Age (years): 41 Referring Provider: Lucianne Lei Height (inches): 79 Interpreting Physician: Baird Lyons MD, ABSM Weight (lbs): 195 RPSGT: Jonna Coup BMI: 35 MRN: [id number] Neck Size: 15.50  CLINICAL INFORMATION Sleep Study Type: HST Indication for sleep study: OSA  Epworth Sleepiness Score: 9  SLEEP STUDY TECHNIQUE A multi-channel overnight portable sleep study was performed. The channels recorded were: nasal airflow, thoracic respiratory movement, and oxygen saturation with a pulse oximetry. Snoring was also monitored.  MEDICATIONS Patient self administered medications include: none reported.  SLEEP ARCHITECTURE Patient was studied for 287.5 minutes. The sleep efficiency was 77.7 % and the patient was supine for 35.8%. The arousal index was 0.0 per hour.  RESPIRATORY PARAMETERS The overall AHI was 97.0 per hour, with a central apnea index of 0.0 per hour.  The oxygen nadir was 86% during sleep.  CARDIAC DATA Mean heart rate during sleep was 73.9 bpm.  IMPRESSIONS - Severe obstructive sleep apnea occurred during this study (AHI = 97.0/h). - No significant central sleep apnea occurred during this study (CAI = 0.0/h). - Moderate oxygen desaturation was noted during this study (Min O2 = 86%). - Patient snored.  DIAGNOSIS - Obstructive Sleep Apnea (327.23 [G47.33 ICD-10])  RECOMMENDATIONS - Suggest CPAP titration sleep study or DME autopap. Other options would be based on clinical judgment. - Be careful with alcohol, sedatives and other CNS depressants that may worsen sleep apnea and disrupt normal sleep architecture. - Sleep hygiene should be reviewed to assess factors that may improve sleep quality. - Weight management and regular exercise should be initiated or continued.  [Electronically signed] 11/24/2017 10:10 AM  Baird Lyons MD, ABSM Diplomate, American Board of  Sleep Medicine   NPI: [id number]                          [place], [facility]  ELECTRONICALLY SIGNED ON:  11/24/2017, 10:08 AM [place] PH: [phone]   FX: [phone] [place]

## 2018-01-21 DIAGNOSIS — H6121 Impacted cerumen, right ear: Secondary | ICD-10-CM | POA: Diagnosis not present

## 2018-01-21 DIAGNOSIS — J343 Hypertrophy of nasal turbinates: Secondary | ICD-10-CM | POA: Diagnosis not present

## 2018-01-21 DIAGNOSIS — H938X2 Other specified disorders of left ear: Secondary | ICD-10-CM | POA: Diagnosis not present

## 2018-01-22 DIAGNOSIS — H6121 Impacted cerumen, right ear: Secondary | ICD-10-CM | POA: Insufficient documentation

## 2018-01-22 DIAGNOSIS — H9011 Conductive hearing loss, unilateral, right ear, with unrestricted hearing on the contralateral side: Secondary | ICD-10-CM | POA: Insufficient documentation

## 2018-02-26 DIAGNOSIS — G4730 Sleep apnea, unspecified: Secondary | ICD-10-CM | POA: Diagnosis not present

## 2018-02-26 DIAGNOSIS — J069 Acute upper respiratory infection, unspecified: Secondary | ICD-10-CM | POA: Diagnosis not present

## 2018-02-26 DIAGNOSIS — G629 Polyneuropathy, unspecified: Secondary | ICD-10-CM | POA: Diagnosis not present

## 2018-03-20 DIAGNOSIS — E785 Hyperlipidemia, unspecified: Secondary | ICD-10-CM | POA: Diagnosis not present

## 2018-03-20 DIAGNOSIS — G4700 Insomnia, unspecified: Secondary | ICD-10-CM | POA: Diagnosis not present

## 2018-03-20 DIAGNOSIS — R5383 Other fatigue: Secondary | ICD-10-CM | POA: Diagnosis not present

## 2018-03-21 DIAGNOSIS — Z7689 Persons encountering health services in other specified circumstances: Secondary | ICD-10-CM | POA: Diagnosis not present

## 2018-05-28 DIAGNOSIS — R7989 Other specified abnormal findings of blood chemistry: Secondary | ICD-10-CM

## 2018-05-28 DIAGNOSIS — E349 Endocrine disorder, unspecified: Secondary | ICD-10-CM

## 2018-05-28 DIAGNOSIS — N8501 Benign endometrial hyperplasia: Secondary | ICD-10-CM

## 2018-05-28 HISTORY — DX: Other specified abnormal findings of blood chemistry: R79.89

## 2018-05-28 HISTORY — DX: Benign endometrial hyperplasia: N85.01

## 2018-05-28 HISTORY — DX: Endocrine disorder, unspecified: E34.9

## 2018-10-30 ENCOUNTER — Ambulatory Visit: Payer: 59 | Admitting: Gastroenterology

## 2018-12-11 ENCOUNTER — Ambulatory Visit: Payer: 59 | Admitting: Obstetrics and Gynecology

## 2018-12-11 ENCOUNTER — Other Ambulatory Visit: Payer: Self-pay

## 2018-12-11 ENCOUNTER — Encounter: Payer: Self-pay | Admitting: Obstetrics and Gynecology

## 2018-12-11 ENCOUNTER — Telehealth: Payer: Self-pay | Admitting: Obstetrics and Gynecology

## 2018-12-11 ENCOUNTER — Other Ambulatory Visit (HOSPITAL_COMMUNITY)
Admission: RE | Admit: 2018-12-11 | Discharge: 2018-12-11 | Disposition: A | Discharge status: Home / Self Care | Payer: Self-pay | Source: Ambulatory Visit | Attending: Obstetrics and Gynecology | Admitting: Obstetrics and Gynecology

## 2018-12-11 VITALS — BP 118/72 | HR 80 | Temp 97.1°F | Resp 12 | Ht 63.25 in | Wt 197.0 lb

## 2018-12-11 DIAGNOSIS — N926 Irregular menstruation, unspecified: Secondary | ICD-10-CM | POA: Diagnosis not present

## 2018-12-11 DIAGNOSIS — Z23 Encounter for immunization: Secondary | ICD-10-CM

## 2018-12-11 DIAGNOSIS — N644 Mastodynia: Secondary | ICD-10-CM

## 2018-12-11 DIAGNOSIS — Z01419 Encounter for gynecological examination (general) (routine) without abnormal findings: Secondary | ICD-10-CM | POA: Diagnosis not present

## 2018-12-11 DIAGNOSIS — Z8742 Personal history of other diseases of the female genital tract: Secondary | ICD-10-CM

## 2018-12-11 DIAGNOSIS — Z113 Encounter for screening for infections with a predominantly sexual mode of transmission: Secondary | ICD-10-CM | POA: Diagnosis not present

## 2018-12-11 DIAGNOSIS — N914 Secondary oligomenorrhea: Secondary | ICD-10-CM

## 2018-12-11 LAB — POCT URINE PREGNANCY: Preg Test, Ur: NEGATIVE

## 2018-12-11 NOTE — Telephone Encounter (Signed)
Please schedule a dx bilateral mammogram and left breast US for patient at the [facility].   She has left breast pain and hx of a left breast papilloma.   Thanks.

## 2018-12-11 NOTE — Telephone Encounter (Signed)
Call returned to patient, left detailed message, ok per dpr. Advised of appt scheudled at [facility]. If changes need to be made to appt you may contact [facility] directly at [phone]. If any additional questions, return call to [place], [place] at [facility].    Routing to provider for final review. Patient is agreeable to disposition. Will close encounter.

## 2018-12-11 NOTE — Telephone Encounter (Signed)
Spoke with Dub Mikes at [facility]. Patient scheduled for bilateral Dx MMG and left breast US on 7/22 at 3pm, arrive at 2:45pm.

## 2018-12-11 NOTE — Progress Notes (Signed)
43 y.o. G18P0030 Married [place] American female here as a new-old patient for an annual exam. Patient has had irregular menses.  Hx oligomenorrhea and simple hyperplasia.  Menses are irregular.  LMP was February, and then spotting now.  States she has 4 - 5 menses per year.  No hot flashes.  Not planning on a pregnancy.   Having left breast pain on the side she had the papilloma excised in the past on the same side.  No nipple discharge or bleeding.   Works from home for [employer].  Routine labs with PCP.   UPT - negative.   PCP: Lucianne Lei, MD    No LMP recorded. (Menstrual status: Irregular Periods).     Period Cycle (Days): (irregular) Period Duration (Days): per patient depends on the cycle Period Pattern: (!) Irregular Menstrual Flow: Light Menstrual Control: Tampon Menstrual Control Change Freq (Hours): 2 Dysmenorrhea: (!) Mild Dysmenorrhea Symptoms: Cramping     Sexually active: No.  The current method of family planning is abstinence.    Exercising: No.  The patient does not participate in regular exercise at present. Smoker:  yes  Health Maintenance: Pap:  06/23/12 Neg:Neg HR HPV History of abnormal Pap:  no MMG:  09/15/12 BIRADS 4:Suspicious abnormality; patient has had had left breast mass with breast ductal system excision TDaP:  unsure Gardasil:   no HIV and Hep C: negative in the past Screening Labs: PCP   reports that she has been smoking. She has never used smokeless tobacco. She reports previous alcohol use. She reports that she does not use drugs.  Past Medical History:  Diagnosis Date  . Anemia   . Breast mass, left 10/10/2012   Excised 11/24/12, papilloma on path   . Depression   . Hyperlipidemia   . Medical history non-contributory   . Nipple discharge    left breast  . Sleep apnea    has a cpap for 3 yr-told she will need to bring day of surgery and use post op    Past Surgical History:  Procedure Laterality Date  . BREAST DUCTAL SYSTEM  EXCISION Left 11/24/2012   Procedure: EXCISION DUCTAL SYSTEM BREAST;  Surgeon: Haywood Lasso, MD;  Location: [facility];  Service: General;  Laterality: Left;  . NO PAST SURGERIES      Current Outpatient Medications  Medication Sig Dispense Refill  . citalopram (CELEXA) 10 MG tablet Take 10 mg by mouth every morning.     No current facility-administered medications for this visit.     Family History  Problem Relation Age of Onset  . Cancer Mother 5       breast  . Breast cancer Mother   . Diabetes Father   . Cancer Maternal Grandmother        breast--dec age 61  . Breast cancer Maternal Grandmother   . Ovarian cancer Maternal Aunt        deceased  . Ovarian cancer Paternal Grandmother     Review of Systems  Constitutional: Negative.   HENT: Negative.   Eyes: Negative.   Respiratory: Negative.   Cardiovascular: Negative.   Gastrointestinal: Negative.   Endocrine: Negative.   Genitourinary: Negative.   Musculoskeletal: Negative.   Skin: Negative.   Allergic/Immunologic: Negative.   Neurological: Negative.   Hematological: Negative.   Psychiatric/Behavioral: Negative.     Exam:   BP 118/72 (BP Location: Right Arm, Patient Position: Sitting, Cuff Size: Large)   Pulse 80   Temp (!) 97.1 F (36.2  C) (Temporal)   Resp 12   Ht 5' 3.25" (1.607 m)   Wt 197 lb (89.4 kg)   BMI 34.62 kg/m     General appearance: alert, cooperative and appears stated age Head: normocephalic, without obvious abnormality, atraumatic Neck: no adenopathy, supple, symmetrical, trachea midline and thyroid normal to inspection and palpation Lungs: clear to auscultation bilaterally Breasts: normal appearance, no masses or tenderness, No nipple retraction or dimpling, No nipple discharge or bleeding, No axillary adenopathy Heart: regular rate and rhythm Abdomen: soft, non-tender; no masses, no organomegaly Extremities: extremities normal, atraumatic, no cyanosis or  edema Skin: skin color, texture, turgor normal. No rashes or lesions Lymph nodes: cervical, supraclavicular, and axillary nodes normal. Neurologic: grossly normal  Pelvic: External genitalia:  no lesions              No abnormal inguinal nodes palpated.              Urethra:  normal appearing urethra with no masses, tenderness or lesions              Bartholins and Skenes: normal                 Vagina: normal appearing vagina with normal color and discharge, no lesions              Cervix: no lesions              Pap taken: Yes.   Bimanual Exam:  Uterus:  normal size, contour, position, consistency, mobility, non-tender              Adnexa: no mass, fullness, tenderness              Rectal exam: Yes.  .  Confirms.              Anus:  normal sphincter tone, no lesions  Chaperone was present for exam.  Assessment:   Well woman visit with normal exam. Irregular menses.  Oligomenorrhea.   Hx endometrial hyperplasia.  Smoker.  Hemp. Hx breast papilloma.  Left breast pain.  STD screening.  FH breast and ovarian cancer.  Plan: Mammogram screening discussed.  Will do dx bilateral mammogram and left breast US.  Self breast awareness reviewed. Pap and HR HPV as above. TSH, prolactin, FSH, estradiol, antimullerian hormone. STD screening.  TDap. Gardasil.  Return for endometrial biopsy and pelvic US.  Will discuss referral for genetic counseling and testing at her next appointment.  Follow up annually and prn.   After visit summary provided.

## 2018-12-15 ENCOUNTER — Telehealth: Payer: Self-pay | Admitting: Obstetrics and Gynecology

## 2018-12-15 NOTE — Telephone Encounter (Signed)
Call placed to patient to review benefit and scheduled ultrasound and endometrial biopsy. Left voicemail message requesting a return call

## 2018-12-16 LAB — CYTOLOGY - PAP
Chlamydia: NEGATIVE
Diagnosis: NEGATIVE
HPV: NOT DETECTED
Neisseria Gonorrhea: NEGATIVE
Trichomonas: NEGATIVE

## 2018-12-17 ENCOUNTER — Other Ambulatory Visit: Payer: Self-pay | Admitting: Obstetrics and Gynecology

## 2018-12-17 ENCOUNTER — Ambulatory Visit
Admission: RE | Admit: 2018-12-17 | Discharge: 2018-12-17 | Disposition: A | Discharge status: Home / Self Care | Payer: 59 | Source: Ambulatory Visit | Attending: Obstetrics and Gynecology | Admitting: Obstetrics and Gynecology

## 2018-12-17 ENCOUNTER — Other Ambulatory Visit: Payer: Self-pay

## 2018-12-17 DIAGNOSIS — N644 Mastodynia: Secondary | ICD-10-CM

## 2018-12-17 DIAGNOSIS — N6310 Unspecified lump in the right breast, unspecified quadrant: Secondary | ICD-10-CM

## 2018-12-17 DIAGNOSIS — R599 Enlarged lymph nodes, unspecified: Secondary | ICD-10-CM

## 2018-12-17 LAB — HEP, RPR, HIV PANEL
HIV Screen 4th Generation wRfx: NONREACTIVE
Hepatitis B Surface Ag: NEGATIVE
RPR Ser Ql: NONREACTIVE

## 2018-12-17 LAB — TSH: TSH: 1.13 u[IU]/mL (ref 0.450–4.500)

## 2018-12-17 LAB — HEPATITIS C ANTIBODY: Hep C Virus Ab: <0.1 s/co ratio (ref 0.0–0.9)

## 2018-12-17 LAB — FOLLICLE STIMULATING HORMONE: FSH: 5.8 m[IU]/mL

## 2018-12-17 LAB — PROLACTIN: Prolactin: 7.2 ng/mL (ref 4.8–23.3)

## 2018-12-17 LAB — ANTI MULLERIAN HORMONE: ANTI-MULLERIAN HORMONE (AMH): 17.6 ng/mL — ABNORMAL HIGH

## 2018-12-17 LAB — ESTRADIOL: Estradiol: 52 pg/mL

## 2018-12-17 NOTE — Telephone Encounter (Signed)
Spoke with patient in regards to benefit for ultrasound and endometrial biopsy. Patient understood information presented. Patient advises she is driving and will call back to schedule and discuss benefit in more detail

## 2018-12-18 ENCOUNTER — Encounter: Payer: Self-pay | Admitting: Obstetrics and Gynecology

## 2018-12-22 NOTE — Telephone Encounter (Signed)
Spoke with patient in regards to pelvic ultrasound and endometrial biopsy. The pelvic ultrasound was scheduled at [facility], in error. Patient desires to have pelvic ultrasound with endometrial biopsy with Dr Quincy Simmonds. Patient is scheduled for an ultrasound and endometrial biopsy, on 01/01/2019 with Dr Quincy Simmonds. Patient is aware of the appointment date, arrival time and cancellation policy. No further questions.  I have contacted [facility], spoke with [place]. The pelvic ultrasound scheduled at their office, in error, has been cancelled.  Forwarding to Dr Quincy Simmonds for final review. Patient is agreeable to disposition.   cc: Glorianne Manchester, RN

## 2018-12-24 ENCOUNTER — Ambulatory Visit
Admission: RE | Admit: 2018-12-24 | Discharge: 2018-12-24 | Disposition: A | Discharge status: Home / Self Care | Payer: 59 | Source: Ambulatory Visit | Attending: Obstetrics and Gynecology | Admitting: Obstetrics and Gynecology

## 2018-12-24 ENCOUNTER — Other Ambulatory Visit: Payer: Self-pay

## 2018-12-24 ENCOUNTER — Other Ambulatory Visit: Payer: 59

## 2018-12-24 DIAGNOSIS — N6310 Unspecified lump in the right breast, unspecified quadrant: Secondary | ICD-10-CM

## 2018-12-24 DIAGNOSIS — R599 Enlarged lymph nodes, unspecified: Secondary | ICD-10-CM

## 2018-12-24 NOTE — Telephone Encounter (Signed)
Encounter reviewed and closed.   [place]

## 2019-01-01 ENCOUNTER — Ambulatory Visit (INDEPENDENT_AMBULATORY_CARE_PROVIDER_SITE_OTHER): Payer: 59

## 2019-01-01 ENCOUNTER — Other Ambulatory Visit: Payer: Self-pay | Admitting: Obstetrics and Gynecology

## 2019-01-01 ENCOUNTER — Ambulatory Visit (INDEPENDENT_AMBULATORY_CARE_PROVIDER_SITE_OTHER): Payer: 59 | Admitting: Obstetrics and Gynecology

## 2019-01-01 ENCOUNTER — Other Ambulatory Visit: Payer: Self-pay

## 2019-01-01 ENCOUNTER — Encounter: Payer: Self-pay | Admitting: Obstetrics and Gynecology

## 2019-01-01 VITALS — BP 100/68 | HR 76 | Temp 97.2°F | Ht 63.25 in | Wt 200.0 lb

## 2019-01-01 DIAGNOSIS — N926 Irregular menstruation, unspecified: Secondary | ICD-10-CM

## 2019-01-01 DIAGNOSIS — Z8742 Personal history of other diseases of the female genital tract: Secondary | ICD-10-CM | POA: Diagnosis not present

## 2019-01-01 DIAGNOSIS — N914 Secondary oligomenorrhea: Secondary | ICD-10-CM

## 2019-01-01 DIAGNOSIS — R9389 Abnormal findings on diagnostic imaging of other specified body structures: Secondary | ICD-10-CM

## 2019-01-01 NOTE — Progress Notes (Signed)
GYNECOLOGY  VISIT   HPI: 43 y.o.   Married  [place] American  female   [phone] with No LMP recorded. (Menstrual status: Irregular Periods).   here for pelvic ultrasound and endometrial biopsy.   Spotting since her pap done on 12/11/18.   GYNECOLOGIC HISTORY: No LMP recorded. (Menstrual status: Irregular Periods). Contraception:  abstinence Menopausal hormone therapy: none Last mammogram:  12-17-18---See Epic Last pap smear: 12-11-18 Neg:neg HR HPV        OB History    Gravida  3   Para      Term      Preterm      AB  3   Living        SAB  3   TAB      Ectopic      Multiple      Live Births                 Patient Active Problem List   Diagnosis Date Noted  . 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 Anemia   . Breast mass, left 10/10/2012   Excised 11/24/12, papilloma on path   . Depression   . High anti-Mullerian hormone 2020  . Hyperlipidemia   . Medical history non-contributory   . Nipple discharge    left breast  . PCOS (polycystic ovarian syndrome)   . Sleep apnea    has a cpap for 3 yr-told she will need to bring day of surgery and use post op    Past Surgical History:  Procedure Laterality Date  . BREAST DUCTAL SYSTEM EXCISION Left 11/24/2012   Procedure: EXCISION DUCTAL SYSTEM BREAST;  Surgeon: Haywood Lasso, MD;  Location: [facility];  Service: General;  Laterality: Left;  . BREAST EXCISIONAL BIOPSY Left 2014  . NO PAST SURGERIES      Current Outpatient Medications  Medication Sig Dispense Refill  . citalopram (CELEXA) 20 MG tablet Take 20 mg by mouth daily.     No current facility-administered medications for this visit.      ALLERGIES: Patient has no known allergies.  Family History  Problem Relation Age of Onset  . Cancer Mother 59       breast  . Breast cancer Mother   . Diabetes Father   . Cancer Maternal Grandmother        breast--dec age 42  . Breast  cancer Maternal Grandmother   . Ovarian cancer Maternal Aunt        deceased  . Ovarian cancer Paternal Grandmother     Social History   Socioeconomic History  . Marital status: Married    Spouse name: Not on file  . Number of children: Not on file  . Years of education: Not on file  . Highest education level: Not on file  Occupational History  . Not on file  Social Needs  . Financial resource strain: Not on file  . Food insecurity    Worry: Not on file    Inability: Not on file  . Transportation needs    Medical: Not on file    Non-medical: Not on file  Tobacco Use  . Smoking status: Current Some Day Smoker  . Smokeless tobacco: Never Used  . Tobacco comment: per patient smokes hemp  Substance and Sexual Activity  . Alcohol use: Not Currently  . Drug use: No  . Sexual activity: Not Currently    Partners: Male  Birth control/protection: None, Abstinence  Lifestyle  . Physical activity    Days per week: Not on file    Minutes per session: Not on file  . Stress: Not on file  Relationships  . Social Herbalist on phone: Not on file    Gets together: Not on file    Attends religious service: Not on file    Active member of club or organization: Not on file    Attends meetings of clubs or organizations: Not on file    Relationship status: Not on file  . Intimate partner violence    Fear of current or ex partner: Not on file    Emotionally abused: Not on file    Physically abused: Not on file    Forced sexual activity: Not on file  Other Topics Concern  . Not on file  Social History Narrative   Single, no children   Right handed   Some college   < 1 cup daily    Review of Systems  All other systems reviewed and are negative.   PHYSICAL EXAMINATION:    BP 100/68 (Cuff Size: Large)   Pulse 76   Temp (!) 97.2 F (36.2 C) (Temporal)   Ht 5' 3.25" (1.607 m)   Wt 200 lb (90.7 kg)   BMI 35.15 kg/m     General appearance: alert, cooperative and  appears stated age  Pelvic US Uterus no masses.  EMS 15 mm.  Ovaries with peripheral follicles.  No free fluid.   EMB Consent for procedure.  Sterile prep with Hibiclens.  Paracervical block 10 cc 1% lidocaine, lot [id number], exp 11/26/19. Pipelle passed x 2 to 7 cm.  Tissue to pathology.  Minimal EBL.  No complications.   Chaperone was present for exam.  ASSESSMENT  History of oligomenorrhea and simple hyperplasia.  Not menopausal. Hx PCOS. Elevated anti-Mullerian hormone. Likely due to PCOS.  Smoker.  Hemp.  Recent dx right breast papilloma.   FH breast and ovarian cancer.   PLAN  We discussed tx options - cyclic progesterone, Micronor, Mirena, Depo Provera, hysterectomy if indicated.  She may prefer an oral treatment.  This will also depend on her upcoming breast excisional biopsy.  FU EMB.  Instructions and precautions given. EMB to [facility].   An After Visit Summary was printed and given to the patient.  ___25___ minutes face to face time of which over 50% was spent in counseling.       Addendum; endometrium thickened, echogenic, cannot r/o hyperplasia

## 2019-01-01 NOTE — Patient Instructions (Signed)

## 2019-01-05 LAB — ANATOMIC PATHOLOGY REPORT: PDF Image: 0

## 2019-01-07 ENCOUNTER — Encounter: Payer: Self-pay | Admitting: Obstetrics and Gynecology

## 2019-01-07 ENCOUNTER — Telehealth: Payer: Self-pay

## 2019-01-07 NOTE — Telephone Encounter (Signed)
-----   Message from Nunzio Cobbs, MD sent at 01/07/2019  1:09 PM EDT ----- Please report results of EMB showing complex hyperplasia without atypia.  This needs to be treated in order to reduce risk of developing endometrial cancer.  Options are Mirena IUD, oral high dose progesterone, or hysterectomy if she is unable to do hormonal treatment.  She will need a follow up biopsy in 3 months if she chooses surgical care.   She is in the process of scheduling an excisional breast biopsy, which may also determine her treatment option.   I would welcome her back for a talking visit to review this further.

## 2019-01-07 NOTE — Telephone Encounter (Signed)
Left message to call Kaitlyn at [phone]. 

## 2019-01-09 DIAGNOSIS — G5603 Carpal tunnel syndrome, bilateral upper limbs: Secondary | ICD-10-CM | POA: Insufficient documentation

## 2019-01-12 ENCOUNTER — Other Ambulatory Visit: Payer: Self-pay | Admitting: General Surgery

## 2019-01-12 DIAGNOSIS — Z803 Family history of malignant neoplasm of breast: Secondary | ICD-10-CM

## 2019-01-12 DIAGNOSIS — D241 Benign neoplasm of right breast: Secondary | ICD-10-CM

## 2019-01-12 NOTE — Telephone Encounter (Signed)
Spoke with patient. Results given. Patient verbalizes understanding. Patient is scheduled to have a breast MRI this week and is meeting with the breast surgeon next week. Patient states that the breat surgeon is unsure if it will be safe for her to take hormones at this point. Patient would like to complete care with the surgeon and then make a decision after seeing if she is able to safely take hormones. She will return call to let Dr.Silva know what she decides.

## 2019-01-13 ENCOUNTER — Ambulatory Visit
Admission: RE | Admit: 2019-01-13 | Discharge: 2019-01-13 | Disposition: A | Discharge status: Home / Self Care | Payer: 59 | Source: Ambulatory Visit | Attending: General Surgery | Admitting: General Surgery

## 2019-01-13 ENCOUNTER — Other Ambulatory Visit: Payer: Self-pay

## 2019-01-13 DIAGNOSIS — D241 Benign neoplasm of right breast: Secondary | ICD-10-CM

## 2019-01-13 DIAGNOSIS — Z803 Family history of malignant neoplasm of breast: Secondary | ICD-10-CM

## 2019-01-13 MED ORDER — GADOBUTROL 1 MMOL/ML IV SOLN
10.0000 mL | Freq: Once | INTRAVENOUS | Status: AC | PRN
  Administered 2019-01-13: 12:00:00 10 mL via INTRAVENOUS

## 2019-01-19 ENCOUNTER — Other Ambulatory Visit: Payer: Self-pay | Admitting: General Surgery

## 2019-01-19 DIAGNOSIS — N6310 Unspecified lump in the right breast, unspecified quadrant: Secondary | ICD-10-CM

## 2019-01-20 ENCOUNTER — Other Ambulatory Visit: Payer: Self-pay | Admitting: General Surgery

## 2019-01-20 DIAGNOSIS — N6310 Unspecified lump in the right breast, unspecified quadrant: Secondary | ICD-10-CM

## 2019-01-20 DIAGNOSIS — R9389 Abnormal findings on diagnostic imaging of other specified body structures: Secondary | ICD-10-CM

## 2019-01-22 ENCOUNTER — Other Ambulatory Visit: Payer: 59

## 2019-01-23 ENCOUNTER — Other Ambulatory Visit: Payer: Self-pay | Admitting: General Surgery

## 2019-01-23 ENCOUNTER — Other Ambulatory Visit: Payer: Self-pay

## 2019-01-23 ENCOUNTER — Ambulatory Visit
Admission: RE | Admit: 2019-01-23 | Discharge: 2019-01-23 | Disposition: A | Discharge status: Home / Self Care | Payer: 59 | Source: Ambulatory Visit | Attending: General Surgery | Admitting: General Surgery

## 2019-01-23 DIAGNOSIS — N6310 Unspecified lump in the right breast, unspecified quadrant: Secondary | ICD-10-CM

## 2019-01-28 ENCOUNTER — Ambulatory Visit
Admission: RE | Admit: 2019-01-28 | Discharge: 2019-01-28 | Disposition: A | Discharge status: Home / Self Care | Payer: 59 | Source: Ambulatory Visit | Attending: General Surgery | Admitting: General Surgery

## 2019-01-28 ENCOUNTER — Other Ambulatory Visit: Payer: 59

## 2019-01-28 ENCOUNTER — Other Ambulatory Visit: Payer: Self-pay

## 2019-01-28 DIAGNOSIS — R9389 Abnormal findings on diagnostic imaging of other specified body structures: Secondary | ICD-10-CM

## 2019-01-28 MED ORDER — GADOBUTROL 1 MMOL/ML IV SOLN
10.0000 mL | Freq: Once | INTRAVENOUS | Status: AC | PRN
  Administered 2019-01-28: 10 mL via INTRAVENOUS

## 2019-02-08 NOTE — Telephone Encounter (Signed)
Please contact patient to schedule a follow up appointment with me.  The hyperplasia of her endometrial does need to be addressed, as this can be precancerous if left untreated.   I hope she is making good progress in her breast care.

## 2019-02-09 ENCOUNTER — Telehealth: Payer: Self-pay | Admitting: Obstetrics and Gynecology

## 2019-02-09 NOTE — Telephone Encounter (Signed)
Spoke with patient. See telephone encounter dated 01-07-2019.   Will close encounter.

## 2019-02-09 NOTE — Telephone Encounter (Signed)
Left message to call Bear Osten, RN at [facility] [phone].   

## 2019-02-09 NOTE — Telephone Encounter (Signed)
Call to patient. Advised patient of message as seen below from Dr. Quincy Simmonds. Patient verbalized understanding. Patient states she is scheduled for nurse visit on Wednesday. Patient moved to Dr. Elza Rafter schedule on 02-11-2019 at 1130. Patient agreeable to date and time of appointment. Advised patient could receive 2nd gardasil at appointment.   Routing to provider and will close encounter.

## 2019-02-09 NOTE — Telephone Encounter (Signed)
Patient is returning a call to Jill. °

## 2019-02-11 ENCOUNTER — Ambulatory Visit: Payer: 59 | Admitting: Obstetrics and Gynecology

## 2019-02-11 ENCOUNTER — Ambulatory Visit: Payer: 59

## 2019-02-11 ENCOUNTER — Other Ambulatory Visit: Payer: Self-pay | Admitting: General Surgery

## 2019-02-11 ENCOUNTER — Encounter: Payer: Self-pay | Admitting: Obstetrics and Gynecology

## 2019-02-11 DIAGNOSIS — N6011 Diffuse cystic mastopathy of right breast: Secondary | ICD-10-CM

## 2019-02-13 ENCOUNTER — Ambulatory Visit: Payer: 59 | Admitting: Obstetrics and Gynecology

## 2019-02-13 ENCOUNTER — Telehealth: Payer: Self-pay | Admitting: *Deleted

## 2019-02-13 ENCOUNTER — Other Ambulatory Visit: Payer: Self-pay

## 2019-02-13 ENCOUNTER — Encounter: Payer: Self-pay | Admitting: Obstetrics and Gynecology

## 2019-02-13 ENCOUNTER — Other Ambulatory Visit: Payer: Self-pay | Admitting: General Surgery

## 2019-02-13 VITALS — BP 110/78 | HR 72 | Temp 97.2°F | Resp 14 | Wt 205.0 lb

## 2019-02-13 DIAGNOSIS — Z23 Encounter for immunization: Secondary | ICD-10-CM | POA: Diagnosis not present

## 2019-02-13 DIAGNOSIS — N8501 Benign endometrial hyperplasia: Secondary | ICD-10-CM

## 2019-02-13 DIAGNOSIS — N939 Abnormal uterine and vaginal bleeding, unspecified: Secondary | ICD-10-CM

## 2019-02-13 DIAGNOSIS — N6011 Diffuse cystic mastopathy of right breast: Secondary | ICD-10-CM

## 2019-02-13 NOTE — Progress Notes (Signed)
GYNECOLOGY  VISIT   HPI: 43 y.o.   Married  [place] American  female   [phone] with Patient's last menstrual period was 01/19/2019.   here for follow up and 2nd Gardasil     Patient had oligomenorrhea, thickened endometrium and EMB showing complex endometrial hyperplasia without atypia.   She states she started bleeding prior to her EMB and bled for a week or two after this.  She stopped bleeding right before her birthday, [birth year].   She has been spotting since then.   She has FH of breast and ovarian cancer.   She has surgery on October 16 and will have excisions done.   She will have genetic counseling and testing following the surgery.  GYNECOLOGIC HISTORY: Patient's last menstrual period was 01/19/2019. Contraception:  Abstinence  Menopausal hormone therapy:  n/a Last mammogram:  12-17-18---See Epic Last pap smear:   12/11/18 Neg:Neg HR HPV        OB History    Gravida  3   Para      Term      Preterm      AB  3   Living        SAB  3   TAB      Ectopic      Multiple      Live Births                 Patient Active Problem List   Diagnosis Date Noted  . 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 Anemia   . Breast mass, left 10/10/2012   Excised 11/24/12, papilloma on path   . 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 Depression   . High anti-Mullerian hormone 2020  . Hyperlipidemia   . Medical history non-contributory   . Nipple discharge    left breast  . PCOS (polycystic ovarian syndrome)   . Sleep apnea    has a cpap for 3 yr-told she will need to bring day of surgery and use post op    Past Surgical History:  Procedure Laterality Date  . BREAST DUCTAL SYSTEM EXCISION Left 11/24/2012   Procedure: EXCISION DUCTAL SYSTEM BREAST;  Surgeon: Haywood Lasso, MD;  Location: [place];  Service: General;  Laterality: Left;  . BREAST EXCISIONAL BIOPSY Left 2014  . NO  PAST SURGERIES      Current Outpatient Medications  Medication Sig Dispense Refill  . citalopram (CELEXA) 20 MG tablet Take 20 mg by mouth daily.     No current facility-administered medications for this visit.      ALLERGIES: Patient has no known allergies.  Family History  Problem Relation Age of Onset  . Cancer Mother 14       breast  . Breast cancer Mother   . Diabetes Father   . Cancer Maternal Grandmother        breast--dec age 16  . Breast cancer Maternal Grandmother   . Ovarian cancer Maternal Aunt        deceased  . Ovarian cancer Paternal Grandmother     Social History   Socioeconomic History  . Marital status: Married    Spouse name: Not on file  . Number of children: Not on file  . Years of education: Not on file  . Highest education level: Not on file  Occupational History  . Not on file  Social Needs  . Financial resource strain: Not on file  .  Food insecurity    Worry: Not on file    Inability: Not on file  . Transportation needs    Medical: Not on file    Non-medical: Not on file  Tobacco Use  . Smoking status: Current Some Day Smoker  . Smokeless tobacco: Never Used  . Tobacco comment: per patient smokes hemp  Substance and Sexual Activity  . Alcohol use: Not Currently  . Drug use: No  . Sexual activity: Not Currently    Partners: Male    Birth control/protection: None, Abstinence  Lifestyle  . Physical activity    Days per week: Not on file    Minutes per session: Not on file  . Stress: Not on file  Relationships  . Social Herbalist on phone: Not on file    Gets together: Not on file    Attends religious service: Not on file    Active member of club or organization: Not on file    Attends meetings of clubs or organizations: Not on file    Relationship status: Not on file  . Intimate partner violence    Fear of current or ex partner: Not on file    Emotionally abused: Not on file    Physically abused: Not on file     Forced sexual activity: Not on file  Other Topics Concern  . Not on file  Social History Narrative   Single, no children   Right handed   Some college   < 1 cup daily    Review of Systems  Constitutional: Negative.   HENT: Negative.   Eyes: Negative.   Respiratory: Negative.   Cardiovascular: Negative.   Gastrointestinal: Negative.   Endocrine: Negative.   Genitourinary: Positive for vaginal bleeding.  Musculoskeletal: Negative.   Skin: Negative.   Allergic/Immunologic: Negative.   Neurological: Negative.   Hematological: Negative.   Psychiatric/Behavioral: Negative.     PHYSICAL EXAMINATION:    BP 110/78 (BP Location: Left Arm, Patient Position: Sitting, Cuff Size: Large)   Pulse 72   Temp (!) 97.2 F (36.2 C) (Temporal)   Resp 14   Wt 205 lb (93 kg)   LMP 01/19/2019   BMI 36.03 kg/m     General appearance: alert, cooperative and appears stated age Head: Normocephalic, without obvious abnormality, atraumatic Lungs: clear to auscultation bilaterally Heart: regular rate and rhythm Abdomen: soft, non-tender, no masses,  no organomegaly Extremities: extremities normal, atraumatic, no cyanosis or edema Skin: Skin color, texture, turgor normal. No rashes or lesions No abnormal inguinal nodes palpated Neurologic: Grossly normal  Pelvic: External genitalia:  no lesions              Urethra:  normal appearing urethra with no masses, tenderness or lesions              Bartholins and Skenes: normal                 Vagina: normal appearing vagina with normal color and discharge, no lesions              Cervix: no lesions                Bimanual Exam:  Uterus:  normal size, contour, position, consistency, mobility, non-tender              Adnexa: no mass, fullness, tenderness              Chaperone was present for exam.  ASSESSMENT  Complex endometrial  hyperplasia without atypia.  Right breast papilloma.  FH breast and ovarian cancer.   PLAN  We discussed  options for care of complex endometrial hyperplasia - observation with repeat EMB 3 months from her prior EMB, hysteroscopy with dilation and curettage, Mirena IUD, hysterectomy.   Will proceed with hysteroscopy with possible Myosure, dilation and curettage.   Risks, benefits, and alternatives reviewed. Risks include but are not limited to bleeding, infection, damage to surrounding organs including uterine perforation requiring hospitalization and laparoscopy, pulmonary edema, reaction to anesthesia, DVT, PE, death, need for further treatment.   Surgical expectations and recovery discussed.  Patient wishes to proceed. ACOG HO to patient on hysteroscopy with dilation and curettage.  She will have genetic testing after her breast surgery.   HPV vaccine today.   An After Visit Summary was printed and given to the patient.  ___40___ minutes face to face time of which over 50% was spent in counseling.

## 2019-02-13 NOTE — Telephone Encounter (Signed)
Call to patient to review surgery date options. Patient agreeable to 03-03-19. Will schedule and call patient back.

## 2019-02-13 NOTE — Patient Instructions (Signed)
Endometrial Hyperplasia ° °Endometrial hyperplasia is abnormal thickening of the tissue that lines the inside of the uterus (endometrium). This condition can also cause cell changes (atypia) that can lead to endometrial cancer. °There are four types of endometrial hyperplasia: °· Endometrial thickening only (simple hyperplasia). °· Endometrial thickening and crowding of cells (complex hyperplasia). °· Endometrial thickening and cell changes (simple atypical hyperplasia). This type has a low risk of becoming cancerous. °· Endometrial thickening, cell crowding, and cell changes (complex atypical hyperplasia). This type has a higher risk of becoming cancerous. °Early diagnosis and treatment are important to reduce the risk of cancer. °What are the causes? °This condition is caused by an imbalance of the reproductive hormones estrogen and progesterone. At the beginning of your menstrual cycle, your ovaries make estrogen. This thickens the lining of your uterus to prepare for pregnancy. If pregnancy does not occur, your estrogen level drops. Then, the hormone progesterone prompts your body to shed the lining of your uterus. This is your menstrual period. Endometrial hyperplasia results from too much estrogen and not enough progesterone. °What increases the risk? °The following factors may make you more likely to develop this condition: °· Being older than 41 years of age. The condition is most common just before or after menopause. Menopause means 12 months without a menstrual period. °· Taking an estrogen medicine or a medicine that acts like estrogen. °· Having polycystic ovary syndrome (PCOS). °· Being overweight. °· Smoking. °· Having diabetes. °· Having irregular periods. °· Having started periods early. °· Having started menopause late. °· Never having been pregnant. °· Having a family history of uterine, ovarian, or colon cancer. °· Having other diseases such as gallbladder or thyroid disease. °What are the signs  or symptoms? °The main symptom of this condition is abnormal vaginal bleeding. The bleeding may: °· Be heavier and longer during menstrual periods. °· Happen more often than a normal menstrual cycle. °· Occur after menopause. °How is this diagnosed? °This condition may be diagnosed based on: °· Your symptoms and medical history, including risk factors. °· A physical exam. °· Tests, such as: °? An imaging study done with sound waves (transvaginal ultrasound) to check the endometrium. This test uses a sound wave probe that is inserted into your vagina. °? A procedure to take a sample of endometrial tissue through the vagina so it can be looked at under a microscope (endometrial biopsy). °? A procedure in which tissue is scraped or suctioned from the inside of the uterus (dilation and curettage). °? A procedure in which a thin, lighted device is inserted into the uterus through the cervix to see inside the uterus (hysteroscopy). °How is this treated? °Treatment for this condition depends on the type of hyperplasia that you have. Synthetic progesterone (progestin) is the most common treatment. Progestin can be given as a pill, injection, or vaginal cream, or as a device that is implanted in your uterus (intrauterine device, IUD). °If you have hyperplasia with atypia, you may need surgery to remove your uterus (hysterectomy). You may be more likely to have this procedure if: °· You have complex atypical hyperplasia. °· You are past menopause. °· You do not want to become pregnant. °Follow these instructions at home: ° °· Take over-the-counter and prescription medicines only as told by your health care provider. °· Do not use any products that contain nicotine or tobacco, such as cigarettes and e-cigarettes. If you need help quitting, ask your health care provider. °· Lose weight if you are overweight.   Ask your health care provider to recommend a diet and exercise program to help you reach and maintain a healthy  weight.  Keep all follow-up visits as told by your health care provider. This is important. Contact a health care provider if:  You have periods that are heavier or last longer than usual.  You have your period more often than usual.  You have vaginal bleeding after menopause. Get help right away if:  You have vaginal bleeding that is so heavy that you need to change your tampon or sanitary napkin after less than 2 hours.  You pass blood clots that are larger than the size of a quarter. Summary  Endometrial hyperplasia is abnormal thickening of the tissue that lines the inside of the uterus (endometrium).  The main symptom of this condition is abnormal vaginal bleeding. This may be heavier, longer, or more frequent periods, or it may be vaginal bleeding after menopause.  Some types of hyperplasia also cause cell changes (atypia) that can lead to cancer.  Always let your health care provider know about abnormal vaginal bleeding.  Early diagnosis and treatment are important to reduce the risk of cancer. This information is not intended to replace advice given to you by your health care provider. Make sure you discuss any questions you have with your health care provider. Document Released: 02/24/2016 Document Revised: 02/24/2016 Document Reviewed: 02/24/2016 Elsevier Patient Education  2020 Elsevier Inc.  

## 2019-02-16 ENCOUNTER — Other Ambulatory Visit: Payer: Self-pay | Admitting: General Surgery

## 2019-02-16 DIAGNOSIS — N6011 Diffuse cystic mastopathy of right breast: Secondary | ICD-10-CM

## 2019-02-16 NOTE — Telephone Encounter (Signed)
Call to patient. Surgery information form reviewed in detail with patient and she verbalized understanding. 2 week post op scheduled for 03-18-2019 at 37 and patient agreeable to date and time of appointment. Patient advised presurgical Covid screening scheduled for 02-27-2019 at 0940. Patient aware will need to quarantine after testing until surgery date. Patient agreeable. Patient aware will have separate pre op at [facility]. RN advised would mail a copy of Surgery Information Form to patient. Address on file confirmed.   Routing to provider and will close encounter.   Cc Lamont Snowball, RN

## 2019-02-23 NOTE — H&P (Signed)
Office Visit  02/13/2019 [place] Silva, Everardo All, MD Obstetrics and Gynecology  Complex endometrial hyperplasia without atypia +2 more Dx  Office Visit   ; Referred by Lucianne Lei, MD Reason for Visit  Additional Documentation  Vitals:    BP 110/78 (BP Location: Left Arm, Patient Position: Sitting, Cuff Size: Large)  Pulse 72  Temp 97.2 F (36.2 C)  (Temporal)  Resp 14  Wt 93 kg  LMP 01/19/2019  BMI 36.03 kg/m  BSA 2.04 m  Flowsheets:    MEWS Score,  Anthropometrics,  Method of Visit    Encounter Info:   Billing Info,  History,  Allergies,  Detailed Report    Orthostatic Vitals Recorded in This Encounter   02/13/2019  0904     Patient Position: Sitting  BP Location: Left Arm  Cuff Size: Large  All Notes   Progress Notes by Nunzio Cobbs, MD at 02/13/2019 9:00 AM Author: Nunzio Cobbs, MD Author Type: Physician Filed: 02/16/2019  5:20 PM  Note Status: Signed Cosign: Cosign Not Required Encounter Date: 02/13/2019  Editor: Nunzio Cobbs, MD (Physician)  Prior Versions: 1. Archie Balboa CMA (Certified Psychologist, sport and exercise) at 02/13/2019  9:08 AM - Sign when Signing Visit    GYNECOLOGY  VISIT   HPI: 43 y.o.   Married  [place] American  female   [phone] with Patient's last menstrual period was 01/19/2019.   here for follow up and 2nd Gardasil      Patient had oligomenorrhea, thickened endometrium and EMB showing complex endometrial hyperplasia without atypia.    She states she started bleeding prior to her EMB and bled for a week or two after this.  She stopped bleeding right before her birthday, [birth year].   She has been spotting since then.    She has FH of breast and ovarian cancer.   She has surgery on October 16 and will have excisions done.   She will have genetic counseling and testing following the surgery.   GYNECOLOGIC HISTORY: Patient's last menstrual period  was 01/19/2019. Contraception:  Abstinence  Menopausal hormone therapy:  n/a Last mammogram:  12-17-18---See Epic Last pap smear:   12/11/18 Neg:Neg HR HPV                OB History     Gravida  3   Para      Term      Preterm      AB  3   Living         SAB  3   TAB      Ectopic      Multiple      Live Births                        Patient Active Problem List    Diagnosis Date Noted  . Simple endometrial hyperplasia 03/12/2013         Past Medical History:  Diagnosis Date  . Anemia    . Breast mass, left 10/10/2012    Excised 11/24/12, papilloma on path   . 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 Depression    . High anti-Mullerian hormone 2020  . Hyperlipidemia    . Medical history non-contributory    . Nipple discharge      left breast  . PCOS (polycystic ovarian syndrome)    . Sleep apnea  has a cpap for 3 yr-told she will need to bring day of surgery and use post op          Past Surgical History:  Procedure Laterality Date  . BREAST DUCTAL SYSTEM EXCISION Left 11/24/2012    Procedure: EXCISION DUCTAL SYSTEM BREAST;  Surgeon: Haywood Lasso, MD;  Location: [place];  Service: General;  Laterality: Left;  . BREAST EXCISIONAL BIOPSY Left 2014  . NO PAST SURGERIES               Current Outpatient Medications  Medication Sig Dispense Refill  . citalopram (CELEXA) 20 MG tablet Take 20 mg by mouth daily.        No current facility-administered medications for this visit.       ALLERGIES: Patient has no known allergies.        Family History  Problem Relation Age of Onset  . Cancer Mother 54        breast  . Breast cancer Mother    . Diabetes Father    . Cancer Maternal Grandmother          breast--dec age 80  . Breast cancer Maternal Grandmother    . Ovarian cancer Maternal Aunt          deceased  . Ovarian cancer Paternal Grandmother       Social History         Socioeconomic History   . Marital status: Married      Spouse name: Not on file  . Number of children: Not on file  . Years of education: Not on file  . Highest education level: Not on file  Occupational History  . Not on file  Social Needs  . Financial resource strain: Not on file  . Food insecurity      Worry: Not on file      Inability: Not on file  . Transportation needs      Medical: Not on file      Non-medical: Not on file  Tobacco Use  . Smoking status: Current Some Day Smoker  . Smokeless tobacco: Never Used  . Tobacco comment: per patient smokes hemp  Substance and Sexual Activity  . Alcohol use: Not Currently  . Drug use: No  . Sexual activity: Not Currently      Partners: Male      Birth control/protection: None, Abstinence  Lifestyle  . Physical activity      Days per week: Not on file      Minutes per session: Not on file  . Stress: Not on file  Relationships  . Social Chief Strategy Officer on phone: Not on file      Gets together: Not on file      Attends religious service: Not on file      Active member of club or organization: Not on file      Attends meetings of clubs or organizations: Not on file      Relationship status: Not on file  . Intimate partner violence      Fear of current or ex partner: Not on file      Emotionally abused: Not on file      Physically abused: Not on file      Forced sexual activity: Not on file  Other Topics Concern  . Not on file  Social History Narrative    Single, no children    Right handed  Some college    < 1 cup daily     Review of Systems  Constitutional: Negative.   HENT: Negative.   Eyes: Negative.   Respiratory: Negative.   Cardiovascular: Negative.   Gastrointestinal: Negative.   Endocrine: Negative.   Genitourinary: Positive for vaginal bleeding.  Musculoskeletal: Negative.  Skin: Negative.   Allergic/Immunologic: Negative.   Neurological: Negative.   Hematological: Negative.   Psychiatric/Behavioral: Negative.        PHYSICAL EXAMINATION:     BP 110/78 (BP Location: Left Arm, Patient Position: Sitting, Cuff Size: Large)   Pulse 72   Temp (!) 97.2 F (36.2 C) (Temporal)   Resp 14   Wt 205 lb (93 kg)   LMP 01/19/2019   BMI 36.03 kg/m     General appearance: alert, cooperative and appears stated age Head: Normocephalic, without obvious abnormality, atraumatic Lungs: clear to auscultation bilaterally Heart: regular rate and rhythm Abdomen: soft, non-tender, no masses,  no organomegaly Extremities: extremities normal, atraumatic, no cyanosis or edema Skin: Skin color, texture, turgor normal. No rashes or lesions No abnormal inguinal nodes palpated Neurologic: Grossly normal   Pelvic: External genitalia:  no lesions              Urethra:  normal appearing urethra with no masses, tenderness or lesions              Bartholins and Skenes: normal                 Vagina: normal appearing vagina with normal color and discharge, no lesions              Cervix: no lesions                Bimanual Exam:  Uterus:  normal size, contour, position, consistency, mobility, non-tender              Adnexa: no mass, fullness, tenderness               Chaperone was present for exam.   ASSESSMENT   Complex endometrial hyperplasia without atypia.  Right breast papilloma.  FH breast and ovarian cancer.    PLAN   We discussed options for care of complex endometrial hyperplasia - observation with repeat EMB 3 months from her prior EMB, hysteroscopy with dilation and curettage, Mirena IUD, hysterectomy.    Will proceed with hysteroscopy with possible Myosure, dilation and curettage.   Risks, benefits, and alternatives reviewed. Risks include but are not limited to bleeding, infection, damage to surrounding organs including uterine perforation requiring hospitalization and laparoscopy, pulmonary edema, reaction to anesthesia, DVT, PE, death, need for further treatment.   Surgical expectations and recovery  discussed.  Patient wishes to proceed. ACOG HO to patient on hysteroscopy with dilation and curettage.   She will have genetic testing after her breast surgery.    HPV vaccine today.    An After Visit Summary was printed and given to the patient.   ___40___ minutes face to face time of which over 50% was spent in counseling.        Patient Instructions by Nunzio Cobbs, MD at 02/13/2019 9:00 AM Author: Nunzio Cobbs, MD Author Type: Physician Filed: 02/13/2019  9:52 AM  Note Status: Signed Cosign: Cosign Not Required Encounter Date: 02/13/2019  Editor: Nunzio Cobbs, MD (Physician)

## 2019-02-25 ENCOUNTER — Encounter (HOSPITAL_BASED_OUTPATIENT_CLINIC_OR_DEPARTMENT_OTHER): Payer: Self-pay | Admitting: *Deleted

## 2019-02-25 ENCOUNTER — Other Ambulatory Visit: Payer: Self-pay

## 2019-02-25 NOTE — Progress Notes (Signed)
Patient requested to have lab work drawn at [facility] on [address]. [place] Message left with [facility] scheduler for Dr. Quincy Simmonds to have order for CBC and BMET faxed to [place].

## 2019-02-25 NOTE — Progress Notes (Addendum)
Spoke with patient via telephone for pre op interview. NPO after MN. No medications AM of surgery.Pre op lab work CBC and BMET will be completed 10/2. Will need UPT AM of surgery. Patient instructed to bring CPAP DOS.Arrival time 0530.

## 2019-02-26 ENCOUNTER — Telehealth: Payer: Self-pay | Admitting: Obstetrics and Gynecology

## 2019-02-26 DIAGNOSIS — Z01818 Encounter for other preprocedural examination: Secondary | ICD-10-CM

## 2019-02-26 LAB — BASIC METABOLIC PANEL
BUN/Creatinine Ratio: 9 (ref 9–23)
BUN: 7 mg/dL (ref 6–24)
CO2: 21 mmol/L (ref 20–29)
Calcium: 9.2 mg/dL (ref 8.7–10.2)
Chloride: 104 mmol/L (ref 96–106)
Creatinine, Ser: 0.77 mg/dL (ref 0.57–1.00)
GFR calc Af Amer: 111 mL/min/{1.73_m2} (ref >59)
GFR calc non Af Amer: 96 mL/min/{1.73_m2} (ref >59)
Glucose: 111 mg/dL — ABNORMAL HIGH (ref 65–99)
Potassium: 4.4 mmol/L (ref 3.5–5.2)
Sodium: 139 mmol/L (ref 134–144)

## 2019-02-26 LAB — CBC
Hematocrit: 38.5 % (ref 34.0–46.6)
Hemoglobin: 12.7 g/dL (ref 11.1–15.9)
MCH: 26.3 pg — ABNORMAL LOW (ref 26.6–33.0)
MCHC: 33 g/dL (ref 31.5–35.7)
MCV: 80 fL (ref 79–97)
Platelets: 303 10*3/uL (ref 150–450)
RBC: 4.83 x10E6/uL (ref 3.77–5.28)
RDW: 12.3 % (ref 11.7–15.4)
WBC: 7.9 10*3/uL (ref 3.4–10.8)

## 2019-02-26 NOTE — Telephone Encounter (Signed)
Spoke with patient. Patient states she is at [facility] for preop labs, no orders placed.   Future lab Order placed for CBC and BMP, orders released.   Confirmed with patient that labs could be viewed by [facility].   Routing to provider for final review. Patient is agreeable to disposition. Will close encounter.

## 2019-02-27 ENCOUNTER — Other Ambulatory Visit (HOSPITAL_COMMUNITY)
Admission: RE | Admit: 2019-02-27 | Discharge: 2019-02-27 | Disposition: A | Discharge status: Home / Self Care | Payer: 59 | Source: Ambulatory Visit | Attending: Obstetrics and Gynecology | Admitting: Obstetrics and Gynecology

## 2019-02-27 DIAGNOSIS — Z01812 Encounter for preprocedural laboratory examination: Secondary | ICD-10-CM | POA: Diagnosis present

## 2019-02-27 DIAGNOSIS — Z20828 Contact with and (suspected) exposure to other viral communicable diseases: Secondary | ICD-10-CM | POA: Insufficient documentation

## 2019-03-01 LAB — NOVEL CORONAVIRUS, NAA (HOSP ORDER, SEND-OUT TO REF LAB; TAT 18-24 HRS): SARS-CoV-2, NAA: NOT DETECTED

## 2019-03-02 NOTE — Anesthesia Preprocedure Evaluation (Addendum)
Anesthesia Evaluation  Patient identified by MRN, date of birth, ID band Patient awake    Reviewed: Allergy & Precautions  Airway Mallampati: II  TM Distance: >3 FB Neck ROM: Full  Mouth opening: Limited Mouth Opening  Dental no notable dental hx. (+) Teeth Intact, Dental Advisory Given   Pulmonary sleep apnea and Continuous Positive Airway Pressure Ventilation , Current Smoker and Patient abstained from smoking.,    Pulmonary exam normal breath sounds clear to auscultation       Cardiovascular negative cardio ROS Normal cardiovascular exam Rhythm:Regular Rate:Normal     Neuro/Psych PSYCHIATRIC DISORDERS Depression negative neurological ROS     GI/Hepatic negative GI ROS, Neg liver ROS,   Endo/Other  Obesity BMI 36  Renal/GU negative Renal ROS  negative genitourinary   Musculoskeletal negative musculoskeletal ROS (+)   Abdominal Normal abdominal exam  (+) + obese,   Peds  Hematology  (+) anemia ,   Anesthesia Other Findings   Reproductive/Obstetrics PCOS, endometrial hyperplasia                            Anesthesia Physical  Anesthesia Plan  ASA: II  Anesthesia Plan: General   Post-op Pain Management:    Induction: Intravenous  PONV Risk Score and Plan: 3 and Ondansetron, Dexamethasone, Midazolam and Treatment may vary due to age or medical condition  Airway Management Planned: LMA  Additional Equipment: None  Intra-op Plan:   Post-operative Plan: Extubation in OR  Informed Consent: I have reviewed the patients History and Physical, chart, labs and discussed the procedure including the risks, benefits and alternatives for the proposed anesthesia with the patient or authorized representative who has indicated his/her understanding and acceptance.     Dental advisory given  Plan Discussed with: CRNA  Anesthesia Plan Comments:         Anesthesia Quick  Evaluation

## 2019-03-03 ENCOUNTER — Encounter (HOSPITAL_BASED_OUTPATIENT_CLINIC_OR_DEPARTMENT_OTHER)
Admission: RE | Disposition: A | Discharge status: Home / Self Care | Payer: Self-pay | Source: Home / Self Care | Attending: Obstetrics and Gynecology

## 2019-03-03 ENCOUNTER — Ambulatory Visit (HOSPITAL_BASED_OUTPATIENT_CLINIC_OR_DEPARTMENT_OTHER): Payer: No Typology Code available for payment source | Admitting: Anesthesiology

## 2019-03-03 ENCOUNTER — Encounter (HOSPITAL_BASED_OUTPATIENT_CLINIC_OR_DEPARTMENT_OTHER): Payer: Self-pay | Admitting: *Deleted

## 2019-03-03 ENCOUNTER — Ambulatory Visit (HOSPITAL_BASED_OUTPATIENT_CLINIC_OR_DEPARTMENT_OTHER)
Admission: RE | Admit: 2019-03-03 | Discharge: 2019-03-03 | Disposition: A | Discharge status: Home / Self Care | Payer: No Typology Code available for payment source | Attending: Obstetrics and Gynecology | Admitting: Obstetrics and Gynecology

## 2019-03-03 ENCOUNTER — Other Ambulatory Visit: Payer: Self-pay

## 2019-03-03 DIAGNOSIS — E282 Polycystic ovarian syndrome: Secondary | ICD-10-CM | POA: Diagnosis not present

## 2019-03-03 DIAGNOSIS — N939 Abnormal uterine and vaginal bleeding, unspecified: Secondary | ICD-10-CM

## 2019-03-03 DIAGNOSIS — Z803 Family history of malignant neoplasm of breast: Secondary | ICD-10-CM | POA: Diagnosis not present

## 2019-03-03 DIAGNOSIS — E785 Hyperlipidemia, unspecified: Secondary | ICD-10-CM | POA: Insufficient documentation

## 2019-03-03 DIAGNOSIS — F329 Major depressive disorder, single episode, unspecified: Secondary | ICD-10-CM | POA: Diagnosis not present

## 2019-03-03 DIAGNOSIS — G4730 Sleep apnea, unspecified: Secondary | ICD-10-CM | POA: Insufficient documentation

## 2019-03-03 DIAGNOSIS — F17200 Nicotine dependence, unspecified, uncomplicated: Secondary | ICD-10-CM | POA: Diagnosis not present

## 2019-03-03 DIAGNOSIS — N8501 Benign endometrial hyperplasia: Secondary | ICD-10-CM | POA: Insufficient documentation

## 2019-03-03 DIAGNOSIS — Z8041 Family history of malignant neoplasm of ovary: Secondary | ICD-10-CM | POA: Diagnosis not present

## 2019-03-03 HISTORY — PX: DILATATION & CURETTAGE/HYSTEROSCOPY WITH MYOSURE: SHX6511

## 2019-03-03 LAB — CBC
HCT: 42.1 % (ref 36.0–46.0)
Hemoglobin: 12.9 g/dL (ref 12.0–15.0)
MCH: 25.6 pg — ABNORMAL LOW (ref 26.0–34.0)
MCHC: 30.6 g/dL (ref 30.0–36.0)
MCV: 83.5 fL (ref 80.0–100.0)
Platelets: 298 10*3/uL (ref 150–400)
RBC: 5.04 MIL/uL (ref 3.87–5.11)
RDW: 12.9 % (ref 11.5–15.5)
WBC: 8.8 10*3/uL (ref 4.0–10.5)
nRBC: 0 % (ref 0.0–0.2)

## 2019-03-03 LAB — BASIC METABOLIC PANEL
Anion gap: 16 — ABNORMAL HIGH (ref 5–15)
BUN: 14 mg/dL (ref 6–20)
CO2: 18 mmol/L — ABNORMAL LOW (ref 22–32)
Calcium: 9.3 mg/dL (ref 8.9–10.3)
Chloride: 104 mmol/L (ref 98–111)
Creatinine, Ser: 0.7 mg/dL (ref 0.44–1.00)
GFR calc Af Amer: >60 mL/min (ref >60)
GFR calc non Af Amer: >60 mL/min (ref >60)
Glucose, Bld: 103 mg/dL — ABNORMAL HIGH (ref 70–99)
Potassium: 3.5 mmol/L (ref 3.5–5.1)
Sodium: 138 mmol/L (ref 135–145)

## 2019-03-03 LAB — POCT PREGNANCY, URINE: Preg Test, Ur: NEGATIVE

## 2019-03-03 SURGERY — DILATATION & CURETTAGE/HYSTEROSCOPY WITH MYOSURE
Anesthesia: General | Site: Uterus

## 2019-03-03 MED ORDER — LIDOCAINE HCL (CARDIAC) PF 100 MG/5ML IV SOSY
PREFILLED_SYRINGE | INTRAVENOUS | Status: DC | PRN
  Administered 2019-03-03: 60 mg via INTRAVENOUS

## 2019-03-03 MED ORDER — SODIUM CHLORIDE 0.9 % IR SOLN
Status: DC | PRN
  Administered 2019-03-03: 3000 mL

## 2019-03-03 MED ORDER — HYDROMORPHONE HCL 1 MG/ML IJ SOLN
INTRAMUSCULAR | Status: AC
  Filled 2019-03-03: qty 1

## 2019-03-03 MED ORDER — MIDAZOLAM HCL 5 MG/5ML IJ SOLN
INTRAMUSCULAR | Status: DC | PRN
  Administered 2019-03-03: 2 mg via INTRAVENOUS

## 2019-03-03 MED ORDER — 0.9 % SODIUM CHLORIDE (POUR BTL) OPTIME
TOPICAL | Status: DC | PRN
  Administered 2019-03-03: 500 mL

## 2019-03-03 MED ORDER — MIDAZOLAM HCL 2 MG/2ML IJ SOLN
INTRAMUSCULAR | Status: AC
  Filled 2019-03-03: qty 2

## 2019-03-03 MED ORDER — KETOROLAC TROMETHAMINE 30 MG/ML IJ SOLN
INTRAMUSCULAR | Status: DC | PRN
  Administered 2019-03-03: 30 mg via INTRAVENOUS

## 2019-03-03 MED ORDER — KETOROLAC TROMETHAMINE 30 MG/ML IJ SOLN
INTRAMUSCULAR | Status: AC
  Filled 2019-03-03: qty 1

## 2019-03-03 MED ORDER — PROPOFOL 10 MG/ML IV BOLUS
INTRAVENOUS | Status: DC | PRN
  Administered 2019-03-03: 50 mg via INTRAVENOUS
  Administered 2019-03-03: 30 mg via INTRAVENOUS
  Administered 2019-03-03: 50 mg via INTRAVENOUS
  Administered 2019-03-03: 150 mg via INTRAVENOUS

## 2019-03-03 MED ORDER — OXYCODONE HCL 5 MG PO TABS
5.0000 mg | ORAL_TABLET | Freq: Once | ORAL | Status: DC | PRN
  Filled 2019-03-03: qty 1

## 2019-03-03 MED ORDER — DEXAMETHASONE SODIUM PHOSPHATE 10 MG/ML IJ SOLN
INTRAMUSCULAR | Status: AC
  Filled 2019-03-03: qty 1

## 2019-03-03 MED ORDER — OXYCODONE HCL 5 MG/5ML PO SOLN
5.0000 mg | Freq: Once | ORAL | Status: DC | PRN
  Filled 2019-03-03: qty 5

## 2019-03-03 MED ORDER — ONDANSETRON HCL 4 MG/2ML IJ SOLN
INTRAMUSCULAR | Status: AC
  Filled 2019-03-03: qty 2

## 2019-03-03 MED ORDER — FENTANYL CITRATE (PF) 100 MCG/2ML IJ SOLN
INTRAMUSCULAR | Status: AC
  Filled 2019-03-03: qty 2

## 2019-03-03 MED ORDER — IBUPROFEN 800 MG PO TABS: 800.0000 mg | ORAL_TABLET | Freq: Three times a day (TID) | ORAL | 0 refills | Status: DC | PRN

## 2019-03-03 MED ORDER — DEXAMETHASONE SODIUM PHOSPHATE 4 MG/ML IJ SOLN
INTRAMUSCULAR | Status: DC | PRN
  Administered 2019-03-03: 10 mg via INTRAVENOUS

## 2019-03-03 MED ORDER — ACETAMINOPHEN 500 MG PO TABS
ORAL_TABLET | ORAL | Status: AC
  Filled 2019-03-03: qty 2

## 2019-03-03 MED ORDER — SCOPOLAMINE 1 MG/3DAYS TD PT72
1.0000 | MEDICATED_PATCH | TRANSDERMAL | Status: DC
  Administered 2019-03-03: 1.5 mg via TRANSDERMAL
  Filled 2019-03-03: qty 1

## 2019-03-03 MED ORDER — ONDANSETRON HCL 4 MG/2ML IJ SOLN
INTRAMUSCULAR | Status: DC | PRN
  Administered 2019-03-03: 4 mg via INTRAVENOUS

## 2019-03-03 MED ORDER — PROPOFOL 10 MG/ML IV BOLUS
INTRAVENOUS | Status: AC
  Filled 2019-03-03: qty 40

## 2019-03-03 MED ORDER — HYDROMORPHONE HCL 1 MG/ML IJ SOLN
0.2500 mg | INTRAMUSCULAR | Status: DC | PRN
  Administered 2019-03-03: 0.25 mg via INTRAVENOUS
  Filled 2019-03-03: qty 0.5

## 2019-03-03 MED ORDER — KETOROLAC TROMETHAMINE 30 MG/ML IJ SOLN
30.0000 mg | Freq: Once | INTRAMUSCULAR | Status: DC | PRN
  Filled 2019-03-03: qty 1

## 2019-03-03 MED ORDER — LACTATED RINGERS IV SOLN
INTRAVENOUS | Status: DC
  Administered 2019-03-03: 07:00:00 via INTRAVENOUS
  Filled 2019-03-03: qty 1000

## 2019-03-03 MED ORDER — FENTANYL CITRATE (PF) 100 MCG/2ML IJ SOLN
INTRAMUSCULAR | Status: DC | PRN
  Administered 2019-03-03: 25 ug via INTRAVENOUS
  Administered 2019-03-03: 50 ug via INTRAVENOUS
  Administered 2019-03-03: 25 ug via INTRAVENOUS

## 2019-03-03 MED ORDER — SCOPOLAMINE 1 MG/3DAYS TD PT72
MEDICATED_PATCH | TRANSDERMAL | Status: AC
  Filled 2019-03-03: qty 1

## 2019-03-03 MED ORDER — LIDOCAINE HCL 1 % IJ SOLN
INTRAMUSCULAR | Status: DC | PRN
  Administered 2019-03-03: 10 mL

## 2019-03-03 MED ORDER — ACETAMINOPHEN 500 MG PO TABS
1000.0000 mg | ORAL_TABLET | Freq: Once | ORAL | Status: AC
  Administered 2019-03-03: 1000 mg via ORAL
  Filled 2019-03-03: qty 2

## 2019-03-03 MED ORDER — LACTATED RINGERS IV SOLN
INTRAVENOUS | Status: DC
  Administered 2019-03-03 (×3): via INTRAVENOUS
  Filled 2019-03-03: qty 1000

## 2019-03-03 MED ORDER — MEPERIDINE HCL 25 MG/ML IJ SOLN
6.2500 mg | INTRAMUSCULAR | Status: DC | PRN
  Filled 2019-03-03: qty 1

## 2019-03-03 MED ORDER — LIDOCAINE 2% (20 MG/ML) 5 ML SYRINGE
INTRAMUSCULAR | Status: AC
  Filled 2019-03-03: qty 5

## 2019-03-03 MED ORDER — PROMETHAZINE HCL 25 MG/ML IJ SOLN
6.2500 mg | INTRAMUSCULAR | Status: DC | PRN
  Filled 2019-03-03: qty 1

## 2019-03-03 SURGICAL SUPPLY — 19 items
CANISTER SUCT 3000ML PPV (MISCELLANEOUS) ×1 IMPLANT
CATH ROBINSON RED A/P 16FR (CATHETERS) ×2 IMPLANT
CONT SPEC 4OZ CLIKSEAL STRL BL (MISCELLANEOUS) ×1 IMPLANT
COVER WAND RF STERILE (DRAPES) ×2 IMPLANT
DEVICE MYOSURE LITE (MISCELLANEOUS) IMPLANT
DEVICE MYOSURE REACH (MISCELLANEOUS) IMPLANT
DILATOR CANAL MILEX (MISCELLANEOUS) IMPLANT
GAUZE 4X4 16PLY RFD (DISPOSABLE) ×2 IMPLANT
GLOVE BIO SURGEON STRL SZ 6.5 (GLOVE) ×2 IMPLANT
GOWN STRL REUS W/TWL LRG LVL3 (GOWN DISPOSABLE) ×3 IMPLANT
IV NS IRRIG 3000ML ARTHROMATIC (IV SOLUTION) ×2 IMPLANT
KIT PROCEDURE FLUENT (KITS) ×2 IMPLANT
MYOSURE XL FIBROID (MISCELLANEOUS)
PACK VAGINAL MINOR WOMEN LF (CUSTOM PROCEDURE TRAY) ×2 IMPLANT
PAD OB MATERNITY 4.3X12.25 (PERSONAL CARE ITEMS) ×2 IMPLANT
SEAL CERVICAL OMNI LOK (ABLATOR) IMPLANT
SEAL ROD LENS SCOPE MYOSURE (ABLATOR) ×2 IMPLANT
SYSTEM TISS REMOVAL MYOSURE XL (MISCELLANEOUS) IMPLANT
TOWEL OR 17X26 10 PK STRL BLUE (TOWEL DISPOSABLE) ×4 IMPLANT

## 2019-03-03 NOTE — Anesthesia Postprocedure Evaluation (Signed)
Anesthesia Post Note  Patient: [employer]  Procedure(s) Performed: DILATATION & CURETTAGE/HYSTEROSCOPY (N/A Uterus)     Patient location during evaluation: PACU Anesthesia Type: General Level of consciousness: awake and alert, oriented and patient cooperative Pain management: pain level controlled Vital Signs Assessment: post-procedure vital signs reviewed and stable Respiratory status: spontaneous breathing, nonlabored ventilation and respiratory function stable Cardiovascular status: blood pressure returned to baseline and stable Postop Assessment: no apparent nausea or vomiting Anesthetic complications: no    Last Vitals:  Vitals:   03/03/19 0806 03/03/19 0815  BP: 111/67 111/69  Pulse: 86 84  Resp: 16 18  Temp: 36.4 C   SpO2: 100% 98%    Last Pain:  Vitals:   03/03/19 0549  TempSrc: Oral                 Pervis Hocking

## 2019-03-03 NOTE — H&P (Signed)
[place] Location: [facility] Patient #: [id number] DOB: 12-23-1975 Divorced / Language: English / Race: Black or African American Female      History of Present Illness       This is a 43 year old female who returns for further discussion and definitive surgical planning for multiple papillomas of the right breast. I have discussed her case with Dr. Melanee Spry, Dr. Dorise Bullion, Dr. Nolon Nations, and Dr. Curlene Dolphin at the [facility]. Beatrix Shipper is her PCP. Dr. Quincy Simmonds is her gynecologist. Livia Snellen was my chaperone for the encounter       3 years ago she presented with bloody left nipple discharge. Left retroareolar excision by Dr. Margot Chimes revealed benign papilloma and the discharge resolved.      She recently had screening mammograms in several findings were identified. She also had an ultrasound and MRI and multiple procedures First, there was a small intraductal mass in the right breast at the areolar margin at the 7 o'clock position. Biopsy showed dilated ducts, felt to be discordant, excision recommended. Curlene Dolphin thinks this is a papilloma. Second,Also noted was a complex mass in the right breast at the 1 o'clock position, several centimeters peripheral to the areolar margin, biopsy showed papilloma and excision was recommended. Third, Abnormal right axillary lymph node was biopsied and showed benign lymphatic tissue was felt to be concordant Left breast was felt to be normal Breast MRI was performed and showed biopsy changes in the right retroareolar area. The biopsy clip at the 7 o'clock position had migrated incision Turner put another clip in and was felt to be well positioned Fourth, Dr. Theodosia Blender identified another mass in the right breast at the areolar margin at the 3 o'clock position. This was biopsied and showed papilloma. Fifth, Non-mass enhancement of left breast was biopsied and showed fibrocystic change, benign, concordant.       Past history  significant for anxiety and left breast biopsy for papilloma Family history positive for mother dying age 81 of metastatic breast cancer. Maternal grandmother had breast cancer at 77. Father living with diabetes. 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 I told her all 3 areas need to be excised and she agrees. Postop she needs to be referred for genetics Postop she is to be referred for high-risk breast clinic       After lengthy discussion with Dr. Owens Shark in radiology we have decided the following plan: The area at 7:00 and the area at 3:00 in the periareolar area will be localized with wires since they are very superficial. The third area of papilloma at the 1 o'clock position several centimeters from the nipple will be localized with the radioactive seed Dr. Owens Shark has dictated an addendum to the record identifying each of these 3 areas, the type of clip that is used, and the localization technique that is recommended. Our plan is to have both wires and the single radioactive seed all placed in the morning and proceed with surgery in the afternoon I told the patient we might be able to get the 2 periareolar areas out through a single circumareolar incision and the 1:00 papilloma out through a second incision in the upper inner quadrant. I discussed  the indications, details, techniques, and numerous risk of the surgery with her. She is aware the risks of bleeding, infection, nipple flattening, loss of sensation of the nipple, necrosis of the nipple or areolar skin, cosmetic deformity, reoperation if cancer, nerve damage with chronic pain. She understands all these issues. All of her questions are answered. She agrees with this plan.  Posting sheet  completed Orders entered for 2 wires and one radioactive seed   Allergies  No Known Drug Allergies   Allergies Reconciled   Medication History  No current meds Active. Medications Reconciled  Vitals  Weight: 205.5 lb Height: 63in Body Surface Area: 1.96 m Body Mass Index: 36.4 kg/m  Temp.: 97.14F  Pulse: 106 (Regular)  P.OX: 99% (Room air) BP: 138/82(Sitting, Left Arm, Standard)     Physical Exam General Mental Status-Alert. General Appearance-Not in acute distress. Build & Nutrition-Well nourished. Posture-Normal posture. Gait-Normal. Note: BMI 35   Head and Neck Head-normocephalic, atraumatic with no lesions or palpable masses. Trachea-midline. Thyroid Gland Characteristics - normal size and consistency and no palpable nodules.  Chest and Lung Exam Chest and lung exam reveals -on auscultation, normal breath sounds, no adventitious sounds and normal vocal resonance.  Breast Note: Breast are large. Symmetrical. On the left side she has a circumareolar incision inferiorly from prior surgery well healed. Nipple looks normal. No other skin change mass or axillary adenopathy on the left. On the right side there are biopsy sites but no hematoma or palpable mass. No abnormality of the nipple or areola. No axillary adenopathy.   Cardiovascular Cardiovascular examination reveals -normal heart sounds, regular rate and rhythm with no murmurs and femoral artery auscultation bilaterally reveals normal pulses, no bruits, no thrills.  Abdomen Inspection Inspection of the abdomen reveals - No Hernias. Palpation/Percussion Palpation and Percussion of the abdomen reveal - Soft, Non Tender, No Rigidity (guarding), No hepatosplenomegaly and No Palpable abdominal masses.  Neurologic Neurologic evaluation reveals -alert and oriented x 3 with no impairment of recent or remote memory, normal attention span and ability to concentrate, normal  sensation and normal coordination.  Musculoskeletal Normal Exam - Bilateral-Upper Extremity Strength Normal and Lower Extremity Strength Normal.    Assessment & Plan  PAPILLOMA OF RIGHT BREAST (D24.1)   You have had multiple x-rays and multiple biopsies of your right breast We have not found any cancer I think that you have multiple papillomas The risk of cancer in this situation can be as high as 10% I think that all 3 areas should be removed  The area of distortion at the 7 o'clock position, and the papilloma at the 3 o'clock position are both near the nipple and areola, and both of these will need to be localized with a wire localization technique The papilloma at the 1 o'clock position, some distance away from the nipple, will need to be localized with the radioactive seed Most likely we will use 2 separate incisions We will x-ray the tissue that we removed at the time of surgery to be sure that we get the right areas out The final pathology should be repeated in 3 business days  Dr. Dalbert Batman has discussed the indications, techniques, and risk of the surgery with you in detail We will proceed with scheduling immediately  Because of your family history you are at increased risk for cancer In the future you will be referred for genetic testing and you will be referred to the high risk breast clinic at the [facility] for a consultation  to better define your risk and manage your risk   FAMILY HISTORY OF BREAST CANCER IN FEMALE (Z80.3)   BMI 35.0-35.9,ADULT (Z68.35)   HISTORY OF LEFT BREAST BIOPSY [id number])    Edsel Petrin. Dalbert Batman, M.D., [facility] General and Minimally invasive Surgery Breast and Colorectal Surgery Office:   [phone] Pager:   [phone]

## 2019-03-03 NOTE — Anesthesia Procedure Notes (Signed)
Procedure Name: LMA Insertion Date/Time: 03/03/2019 7:28 AM Performed by: Justice Rocher, CRNA Pre-anesthesia Checklist: Patient identified, Emergency Drugs available, Suction available and Patient being monitored Patient Re-evaluated:Patient Re-evaluated prior to induction Oxygen Delivery Method: Circle system utilized Preoxygenation: Pre-oxygenation with 100% oxygen Induction Type: IV induction Ventilation: Mask ventilation without difficulty LMA: LMA inserted LMA Size: 4.0 Number of attempts: 1 Airway Equipment and Method: Bite block Placement Confirmation: positive ETCO2 and breath sounds checked- equal and bilateral Tube secured with: Tape Dental Injury: Teeth and Oropharynx as per pre-operative assessment

## 2019-03-03 NOTE — Op Note (Signed)
OPERATIVE REPORT   PREOPERATIVE DIAGNOSES:  Complex endometrial hyperplasia without atypia.   POSTOPERATIVE DIAGNOSES:   Complex endometrial hyperplasia without atypia.   PROCEDURE:  Hysteroscopy with dilation and curettage.  SURGEON:  Lenard Galloway, MD  ANESTHESIA:  LMA, paracervical block with 10 mL of 1% lidocaine.  IV FLUIDS:   900 cc LR  EBL:  5 cc   URINE OUTPUT:  25 cc  NORMAL SALINE DEFICIT:   [id number] cc  COMPLICATIONS:  None.  INDICATIONS FOR THE PROCEDURE:     The patient is a 43 year old [id number] African American female with a history of oligomenorrhea and simple endometrial hyperplasia who was diagnosed with thickened endometrium on ultrasound and complex endometrial hyperplasia without atypia on endometrial biopsy.  She is experiencing ongoing vaginal bleeding.   A plan is now made to proceed with a hysteroscopy with dilation and curettage after risks, benefits and alternatives were reviewed.  FINDINGS:  Exam under anesthesia revealed a small anteverted uterus.  No adnexal masses were appreciated. The uterus was sounded to 7 cm.  Hysteroscopy showed thickened overgrown endometrium and tubal ostia were not visible. Endometrial currettings were large in quantity.   After curetting, repeat hysteroscopy showed the uterine cavity had a more normal appearance and the bilateral tubal ostia were visible.   SPECIMENS:  Endometrial curettings were sent to Pathology.  PROCEDURE IN DETAIL:  The patient was reidentified in the preoperative hold area.  She  received TED hose and PAS stockings for DVT prophylaxis.  In the operating room, the patient was placed in the dorsal lithotomy position and then  an LMA anesthetic was introduced.    An exam under anesthesia was performed.  The patient's lower abdomen, vagina and perineum were sterilely prepped with betadine  and the patient's bladder was catheterized of urine.  She was sterilly draped  A speculum was placed inside the  vagina and a single-tooth tenaculum was placed on the anterior cervical lip.  A paracervical block was performed with a total of  10 mL of 1% lidocaine plain.  The uterus was sounded. The cervix was dilated to a #19  Pratt dilator.  The diagnostic hysteroscope was then inserted inside the uterine cavity  under the continuous infusion of normal saline solution.  Findings are as noted above.  The hysteroscope was removed. The sharp and serrated curettes were introduced into the  uterine cavity and the endometrium was curetted in all 4 quadrants.  A large amount of  endometrial curettings was obtained.  This specimen was sent to Pathology.  The  hysteroscope was reintroduced into the endometrial cavity to do a second assessment.   The findings are again noted above.  The single-tooth tenaculum which had been placed on the anterior cervical lip was  removed.  Hemostasis was good, and all of the vaginal instruments were removed.  The patient was awakened and escorted to the recovery room in stable condition after  she was cleansed with betadine.  There were no complications to the procedure.  All  needle, instrument and sponge counts were correct.  Lenard Galloway, MD

## 2019-03-03 NOTE — Progress Notes (Signed)
Update to History and Physical  No marked change in status since office pre-op visit.   Patient examined.   OK to proceed with surgery. 

## 2019-03-03 NOTE — Transfer of Care (Signed)
Immediate Anesthesia Transfer of Care Note  Patient: Kristy Stafford  Procedure(s) Performed: Procedure(s) (LRB): DILATATION & CURETTAGE/HYSTEROSCOPY (N/A)  Patient Location: PACU  Anesthesia Type: General  Level of Consciousness: awake, sedated, patient cooperative and responds to stimulation  Airway & Oxygen Therapy: Patient Spontanous Breathing and Patient connected to [place] O2 and soft FM  Post-op Assessment: Report given to PACU RN, Post -op Vital signs reviewed and stable and Patient moving all extremities  Post vital signs: Reviewed and stable  Complications: No apparent anesthesia complications

## 2019-03-03 NOTE — Discharge Instructions (Signed)
Dilation and Curettage or Vacuum Curettage, Care After These instructions give you information about caring for yourself after your procedure. Your doctor may also give you more specific instructions. Call your doctor if you have any problems or questions after your procedure. Follow these instructions at home: Activity  Do not drive or use heavy machinery while taking prescription pain medicine.  For 24 hours after your procedure, avoid driving.  Take short walks often, followed by rest periods. Ask your doctor what activities are safe for you. After one or two days, you may be able to return to your normal activities.  Do not lift anything that is heavier than 10 lb (4.5 kg) until your doctor approves.  For at least 2 weeks, or as long as told by your doctor: ? Do not douche. ? Do not use tampons. ? Do not have sex. General instructions   Take over-the-counter and prescription medicines only as told by your doctor. This is very important if you take blood thinning medicine.  Do not take baths, swim, or use a hot tub until your doctor approves. Take showers instead of baths.  Wear compression stockings as told by your doctor.  It is up to you to get the results of your procedure. Ask your doctor when your results will be ready.  Keep all follow-up visits as told by your doctor. This is important. Contact a doctor if:  You have very bad cramps that get worse or do not get better with medicine.  You have very bad pain in your belly (abdomen).  You cannot drink fluids without throwing up (vomiting).  You get pain in a different part of the area between your belly and thighs (pelvis).  You have bad-smelling discharge from your vagina.  You have a rash. Get help right away if:  You are bleeding a lot from your vagina. A lot of bleeding means soaking more than one sanitary pad in an hour, for 2 hours in a row.  You have clumps of blood (blood clots) coming from your  vagina.  You have a fever or chills.  Your belly feels very tender or hard.  You have chest pain.  You have trouble breathing.  You cough up blood.  You feel dizzy.  You feel light-headed.  You pass out (faint).  You have pain in your neck or shoulder area. Summary  Take short walks often, followed by rest periods. Ask your doctor what activities are safe for you. After one or two days, you may be able to return to your normal activities.  Do not lift anything that is heavier than 10 lb (4.5 kg) until your doctor approves.  Do not take baths, swim, or use a hot tub until your doctor approves. Take showers instead of baths.  Contact your doctor if you have any symptoms of infection, like bad-smelling discharge from your vagina. This information is not intended to replace advice given to you by your health care provider. Make sure you discuss any questions you have with your health care provider. Document Released: 02/21/2008 Document Revised: 04/26/2017 Document Reviewed: 01/30/2016 Elsevier Patient Education  2020 [place]. Hysteroscopy, Care After This sheet gives you information about how to care for yourself after your procedure. Your health care provider may also give you more specific instructions. If you have problems or questions, contact your health care provider. What can I expect after the procedure? After the procedure, it is common to have:  Cramping.  Bleeding. This can vary  from light spotting to menstrual-like bleeding. Follow these instructions at home: Activity  Rest for 1-2 days after the procedure.  Do not douche, use tampons, or have sex for 2 weeks after the procedure, or until your health care provider approves.  Do not drive for 24 hours after the procedure, or for as long as told by your health care provider.  Do not drive, use heavy machinery, or drink alcohol while taking prescription pain medicines. Medicines   Take over-the-counter  and prescription medicines only as told by your health care provider.  Do not take aspirin during recovery. It can increase the risk of bleeding. General instructions  Do not take baths, swim, or use a hot tub until your health care provider approves. Take showers instead of baths for 2 weeks, or for as long as told by your health care provider.  To prevent or treat constipation while you are taking prescription pain medicine, your health care provider may recommend that you: ? Drink enough fluid to keep your urine clear or pale yellow. ? Take over-the-counter or prescription medicines. ? Eat foods that are high in fiber, such as fresh fruits and vegetables, whole grains, and beans. ? Limit foods that are high in fat and processed sugars, such as fried and sweet foods.  Keep all follow-up visits as told by your health care provider. This is important. Contact a health care provider if:  You feel dizzy or lightheaded.  You feel nauseous.  You have abnormal vaginal discharge.  You have a rash.  You have pain that does not get better with medicine.  You have chills. Get help right away if:  You have bleeding that is heavier than a normal menstrual period.  You have a fever.  You have pain or cramps that get worse.  You develop new abdominal pain.  You faint.  You have pain in your shoulders.  You have shortness of breath. Summary  After the procedure, you may have cramping and some vaginal bleeding.  Do not douche, use tampons, or have sex for 2 weeks after the procedure, or until your health care provider approves.  Do not take baths, swim, or use a hot tub until your health care provider approves. Take showers instead of baths for 2 weeks, or for as long as told by your health care provider.  Report any unusual symptoms to your health care provider.  Keep all follow-up visits as told by your health care provider. This is important. This information is not intended  to replace advice given to you by your health care provider. Make sure you discuss any questions you have with your health care provider. Document Released: 03/04/2013 Document Revised: 04/26/2017 Document Reviewed: 06/12/2016 Elsevier Patient Education  [place] Instructions  Activity: Get plenty of rest for the remainder of the day. A responsible individual must stay with you for 24 hours following the procedure.  For the next 24 hours, DO NOT: -Drive a car -Paediatric nurse -Drink alcoholic beverages -Take any medication unless instructed by your physician -Make any legal decisions or sign important papers.  Meals: Start with liquid foods such as gelatin or soup. Progress to regular foods as tolerated. Avoid greasy, spicy, heavy foods. If nausea and/or vomiting occur, drink only clear liquids until the nausea and/or vomiting subsides. Call your physician if vomiting continues.  Special Instructions/Symptoms: Your throat may feel dry or sore from the anesthesia or the breathing tube placed in your  throat during surgery. If this causes discomfort, gargle with warm salt water. The discomfort should disappear within 24 hours.  If you had a scopolamine patch placed behind your ear for the management of post- operative nausea and/or vomiting:  1. The medication in the patch is effective for 72 hours, after which it should be removed.  Wrap patch in a tissue and discard in the trash. Wash hands thoroughly with soap and water. 2. You may remove the patch earlier than 72 hours if you experience unpleasant side effects which may include dry mouth, dizziness or visual disturbances. 3. Avoid touching the patch. Wash your hands with soap and water after contact with the patch.

## 2019-03-04 ENCOUNTER — Encounter (HOSPITAL_BASED_OUTPATIENT_CLINIC_OR_DEPARTMENT_OTHER): Payer: Self-pay | Admitting: Obstetrics and Gynecology

## 2019-03-04 LAB — SURGICAL PATHOLOGY

## 2019-03-05 ENCOUNTER — Encounter (HOSPITAL_BASED_OUTPATIENT_CLINIC_OR_DEPARTMENT_OTHER): Payer: Self-pay | Admitting: *Deleted

## 2019-03-05 ENCOUNTER — Other Ambulatory Visit: Payer: Self-pay

## 2019-03-06 ENCOUNTER — Telehealth: Payer: Self-pay | Admitting: Obstetrics and Gynecology

## 2019-03-06 NOTE — Telephone Encounter (Signed)
Please contact patient in follow up to her dilation and curettage procedure.  I hope she is feeling well following surgery this week.  I have final pathology results from the hospital showing benign secretory endometrium.   Her office endometrial biopsy from [facility] showed complex endometrial hyperplasia without atypia.   I have just had a conversation with Dr. Lyndon Code, the pathologist who read her surgical specimen from the hospital.  We agreed it would be beneficial for him to review the slides from the office endometrial biopsy as there is discordance from the specimens.  He will contact me back with his evaluation and put in in Epic also.

## 2019-03-06 NOTE — Telephone Encounter (Signed)
Spoke with patient, advised per Dr. Quincy Simmonds. Patient report mild cramping 2/10 and spotting. Is taking Ibuprofen 800 mg q 8h prn. Discussed heating pad for comfort. Patient verbalizes understanding regarding results, is aware she will be contacted with final evaluation. Patient will see Dr. Quincy Simmonds for f/u on 10/21, is aware to call if any concerns.   Routing to provider for final review. Patient is agreeable to disposition. Will close encounter.

## 2019-03-09 NOTE — Progress Notes (Signed)

## 2019-03-10 ENCOUNTER — Other Ambulatory Visit (HOSPITAL_COMMUNITY)
Admission: RE | Admit: 2019-03-10 | Discharge: 2019-03-10 | Disposition: A | Discharge status: Home / Self Care | Payer: 59 | Source: Ambulatory Visit | Attending: General Surgery | Admitting: General Surgery

## 2019-03-10 DIAGNOSIS — Z01812 Encounter for preprocedural laboratory examination: Secondary | ICD-10-CM | POA: Diagnosis present

## 2019-03-10 DIAGNOSIS — Z20828 Contact with and (suspected) exposure to other viral communicable diseases: Secondary | ICD-10-CM | POA: Insufficient documentation

## 2019-03-11 LAB — NOVEL CORONAVIRUS, NAA (HOSP ORDER, SEND-OUT TO REF LAB; TAT 18-24 HRS): SARS-CoV-2, NAA: NOT DETECTED

## 2019-03-13 ENCOUNTER — Ambulatory Visit
Admission: RE | Admit: 2019-03-13 | Discharge: 2019-03-13 | Disposition: A | Discharge status: Home / Self Care | Payer: 59 | Source: Ambulatory Visit | Attending: General Surgery | Admitting: General Surgery

## 2019-03-13 ENCOUNTER — Other Ambulatory Visit: Payer: Self-pay

## 2019-03-13 ENCOUNTER — Ambulatory Visit (HOSPITAL_BASED_OUTPATIENT_CLINIC_OR_DEPARTMENT_OTHER): Payer: No Typology Code available for payment source | Admitting: Anesthesiology

## 2019-03-13 ENCOUNTER — Ambulatory Visit (HOSPITAL_BASED_OUTPATIENT_CLINIC_OR_DEPARTMENT_OTHER)
Admission: RE | Admit: 2019-03-13 | Discharge: 2019-03-13 | Disposition: A | Discharge status: Home / Self Care | Payer: No Typology Code available for payment source | Attending: General Surgery | Admitting: General Surgery

## 2019-03-13 ENCOUNTER — Encounter (HOSPITAL_BASED_OUTPATIENT_CLINIC_OR_DEPARTMENT_OTHER): Payer: Self-pay | Admitting: Emergency Medicine

## 2019-03-13 ENCOUNTER — Encounter (HOSPITAL_BASED_OUTPATIENT_CLINIC_OR_DEPARTMENT_OTHER)
Admission: RE | Disposition: A | Discharge status: Home / Self Care | Payer: Self-pay | Source: Home / Self Care | Attending: General Surgery

## 2019-03-13 DIAGNOSIS — N6011 Diffuse cystic mastopathy of right breast: Secondary | ICD-10-CM

## 2019-03-13 DIAGNOSIS — D241 Benign neoplasm of right breast: Secondary | ICD-10-CM | POA: Insufficient documentation

## 2019-03-13 DIAGNOSIS — N62 Hypertrophy of breast: Secondary | ICD-10-CM | POA: Insufficient documentation

## 2019-03-13 DIAGNOSIS — G4730 Sleep apnea, unspecified: Secondary | ICD-10-CM | POA: Diagnosis not present

## 2019-03-13 DIAGNOSIS — Z87891 Personal history of nicotine dependence: Secondary | ICD-10-CM | POA: Diagnosis not present

## 2019-03-13 DIAGNOSIS — Z803 Family history of malignant neoplasm of breast: Secondary | ICD-10-CM | POA: Diagnosis not present

## 2019-03-13 HISTORY — PX: BREAST LUMPECTOMY WITH NEEDLE LOCALIZATION: SHX5759

## 2019-03-13 HISTORY — PX: BREAST EXCISIONAL BIOPSY: SUR124

## 2019-03-13 HISTORY — PX: BREAST LUMPECTOMY WITH RADIOACTIVE SEED LOCALIZATION: SHX6424

## 2019-03-13 HISTORY — DX: Diffuse cystic mastopathy of right breast: N60.11

## 2019-03-13 SURGERY — BREAST LUMPECTOMY WITH RADIOACTIVE SEED LOCALIZATION
Anesthesia: General | Site: Breast | Laterality: Right

## 2019-03-13 MED ORDER — HYDROMORPHONE HCL 1 MG/ML IJ SOLN
0.2500 mg | INTRAMUSCULAR | Status: DC | PRN
  Administered 2019-03-13 (×2): 0.5 mg via INTRAVENOUS

## 2019-03-13 MED ORDER — DEXAMETHASONE SODIUM PHOSPHATE 10 MG/ML IJ SOLN
INTRAMUSCULAR | Status: AC
  Filled 2019-03-13: qty 1

## 2019-03-13 MED ORDER — ACETAMINOPHEN 500 MG PO TABS
1000.0000 mg | ORAL_TABLET | ORAL | Status: AC
  Administered 2019-03-13: 1000 mg via ORAL

## 2019-03-13 MED ORDER — FENTANYL CITRATE (PF) 100 MCG/2ML IJ SOLN
50.0000 ug | INTRAMUSCULAR | Status: DC | PRN
  Administered 2019-03-13: 100 ug via INTRAVENOUS

## 2019-03-13 MED ORDER — CHLORHEXIDINE GLUCONATE CLOTH 2 % EX PADS: 6.0000 | MEDICATED_PAD | Freq: Once | CUTANEOUS | Status: DC

## 2019-03-13 MED ORDER — CEFAZOLIN SODIUM-DEXTROSE 2-4 GM/100ML-% IV SOLN
2.0000 g | INTRAVENOUS | Status: AC
  Administered 2019-03-13 (×2): 2 g via INTRAVENOUS

## 2019-03-13 MED ORDER — SUCCINYLCHOLINE CHLORIDE 200 MG/10ML IV SOSY
PREFILLED_SYRINGE | INTRAVENOUS | Status: AC
  Filled 2019-03-13: qty 10

## 2019-03-13 MED ORDER — EPHEDRINE SULFATE 50 MG/ML IJ SOLN
INTRAMUSCULAR | Status: DC | PRN
  Administered 2019-03-13 (×2): 10 mg via INTRAVENOUS

## 2019-03-13 MED ORDER — HYDROMORPHONE HCL 1 MG/ML IJ SOLN
INTRAMUSCULAR | Status: AC
  Filled 2019-03-13: qty 0.5

## 2019-03-13 MED ORDER — CELECOXIB 200 MG PO CAPS
200.0000 mg | ORAL_CAPSULE | ORAL | Status: AC
  Administered 2019-03-13: 200 mg via ORAL

## 2019-03-13 MED ORDER — EPHEDRINE 5 MG/ML INJ
INTRAVENOUS | Status: AC
  Filled 2019-03-13: qty 10

## 2019-03-13 MED ORDER — MIDAZOLAM HCL 2 MG/2ML IJ SOLN
INTRAMUSCULAR | Status: AC
  Filled 2019-03-13: qty 2

## 2019-03-13 MED ORDER — ONDANSETRON HCL 4 MG/2ML IJ SOLN
4.0000 mg | Freq: Once | INTRAMUSCULAR | Status: AC | PRN
  Administered 2019-03-13: 4 mg via INTRAVENOUS

## 2019-03-13 MED ORDER — LACTATED RINGERS IV SOLN
INTRAVENOUS | Status: DC
  Administered 2019-03-13 (×2): via INTRAVENOUS

## 2019-03-13 MED ORDER — CELECOXIB 200 MG PO CAPS
ORAL_CAPSULE | ORAL | Status: AC
  Filled 2019-03-13: qty 1

## 2019-03-13 MED ORDER — MIDAZOLAM HCL 2 MG/2ML IJ SOLN
1.0000 mg | INTRAMUSCULAR | Status: DC | PRN
  Administered 2019-03-13: 2 mg via INTRAVENOUS

## 2019-03-13 MED ORDER — GABAPENTIN 300 MG PO CAPS
ORAL_CAPSULE | ORAL | Status: AC
  Filled 2019-03-13: qty 1

## 2019-03-13 MED ORDER — SODIUM CHLORIDE 0.9 % IV SOLN
INTRAVENOUS | Status: DC | PRN
  Administered 2019-03-13: 40 ug/min via INTRAVENOUS

## 2019-03-13 MED ORDER — ACETAMINOPHEN 500 MG PO TABS
ORAL_TABLET | ORAL | Status: AC
  Filled 2019-03-13: qty 2

## 2019-03-13 MED ORDER — HYDROCODONE-ACETAMINOPHEN 5-325 MG PO TABS: 1.0000 | ORAL_TABLET | Freq: Four times a day (QID) | ORAL | 0 refills | Status: DC | PRN

## 2019-03-13 MED ORDER — PROPOFOL 10 MG/ML IV BOLUS
INTRAVENOUS | Status: DC | PRN
  Administered 2019-03-13: 150 mg via INTRAVENOUS

## 2019-03-13 MED ORDER — BUPIVACAINE HCL (PF) 0.5 % IJ SOLN
INTRAMUSCULAR | Status: DC | PRN
  Administered 2019-03-13: 6 mL

## 2019-03-13 MED ORDER — ONDANSETRON HCL 4 MG/2ML IJ SOLN
INTRAMUSCULAR | Status: AC
  Filled 2019-03-13: qty 2

## 2019-03-13 MED ORDER — CEFAZOLIN SODIUM-DEXTROSE 2-4 GM/100ML-% IV SOLN
INTRAVENOUS | Status: AC
  Filled 2019-03-13: qty 100

## 2019-03-13 MED ORDER — PHENYLEPHRINE HCL (PRESSORS) 10 MG/ML IV SOLN
INTRAVENOUS | Status: DC | PRN
  Administered 2019-03-13: 80 ug via INTRAVENOUS

## 2019-03-13 MED ORDER — LIDOCAINE 2% (20 MG/ML) 5 ML SYRINGE
INTRAMUSCULAR | Status: AC
  Filled 2019-03-13: qty 5

## 2019-03-13 MED ORDER — DEXAMETHASONE SODIUM PHOSPHATE 4 MG/ML IJ SOLN
INTRAMUSCULAR | Status: DC | PRN
  Administered 2019-03-13: 5 mg via INTRAVENOUS

## 2019-03-13 MED ORDER — DIPHENHYDRAMINE HCL 50 MG/ML IJ SOLN
INTRAMUSCULAR | Status: AC
  Filled 2019-03-13: qty 1

## 2019-03-13 MED ORDER — MEPERIDINE HCL 25 MG/ML IJ SOLN: 6.2500 mg | INTRAMUSCULAR | Status: DC | PRN

## 2019-03-13 MED ORDER — PHENYLEPHRINE 40 MCG/ML (10ML) SYRINGE FOR IV PUSH (FOR BLOOD PRESSURE SUPPORT)
PREFILLED_SYRINGE | INTRAVENOUS | Status: AC
  Filled 2019-03-13: qty 10

## 2019-03-13 MED ORDER — LIDOCAINE HCL (CARDIAC) PF 100 MG/5ML IV SOSY
PREFILLED_SYRINGE | INTRAVENOUS | Status: DC | PRN
  Administered 2019-03-13: 100 mg via INTRAVENOUS

## 2019-03-13 MED ORDER — PHENYLEPHRINE HCL (PRESSORS) 10 MG/ML IV SOLN
INTRAVENOUS | Status: AC
  Filled 2019-03-13: qty 1

## 2019-03-13 MED ORDER — FENTANYL CITRATE (PF) 100 MCG/2ML IJ SOLN
INTRAMUSCULAR | Status: AC
  Filled 2019-03-13: qty 2

## 2019-03-13 MED ORDER — GABAPENTIN 300 MG PO CAPS
300.0000 mg | ORAL_CAPSULE | ORAL | Status: AC
  Administered 2019-03-13: 300 mg via ORAL

## 2019-03-13 SURGICAL SUPPLY — 75 items
ADH SKN CLS APL DERMABOND .7 (GAUZE/BANDAGES/DRESSINGS) ×1
APL PRP STRL LF DISP 70% ISPRP (MISCELLANEOUS) ×1
APL SKNCLS STERI-STRIP NONHPOA (GAUZE/BANDAGES/DRESSINGS)
APPLIER CLIP 11 MED OPEN (CLIP)
APPLIER CLIP 9.375 MED OPEN (MISCELLANEOUS) ×2
APR CLP MED 11 20 MLT OPN (CLIP)
APR CLP MED 9.3 20 MLT OPN (MISCELLANEOUS) ×1
BENZOIN TINCTURE PRP APPL 2/3 (GAUZE/BANDAGES/DRESSINGS) IMPLANT
BINDER BREAST LRG (GAUZE/BANDAGES/DRESSINGS) IMPLANT
BINDER BREAST MEDIUM (GAUZE/BANDAGES/DRESSINGS) IMPLANT
BINDER BREAST XLRG (GAUZE/BANDAGES/DRESSINGS) IMPLANT
BINDER BREAST XXLRG (GAUZE/BANDAGES/DRESSINGS) ×1 IMPLANT
BLADE HEX COATED 2.75 (ELECTRODE) ×2 IMPLANT
BLADE SURG 10 STRL SS (BLADE) IMPLANT
BLADE SURG 15 STRL LF DISP TIS (BLADE) ×1 IMPLANT
BLADE SURG 15 STRL SS (BLADE) ×2
BNDG ELASTIC 6X5.8 VLCR STR LF (GAUZE/BANDAGES/DRESSINGS) IMPLANT
CANISTER SUC SOCK COL 7IN (MISCELLANEOUS) IMPLANT
CANISTER SUCT 1200ML W/VALVE (MISCELLANEOUS) ×2 IMPLANT
CHLORAPREP W/TINT 26 (MISCELLANEOUS) ×2 IMPLANT
CLIP APPLIE 11 MED OPEN (CLIP) IMPLANT
CLIP APPLIE 9.375 MED OPEN (MISCELLANEOUS) IMPLANT
COVER BACK TABLE REUSABLE LG (DRAPES) ×2 IMPLANT
COVER MAYO STAND REUSABLE (DRAPES) ×2 IMPLANT
COVER PROBE W GEL 5X96 (DRAPES) ×2 IMPLANT
COVER WAND RF STERILE (DRAPES) IMPLANT
DECANTER SPIKE VIAL GLASS SM (MISCELLANEOUS) IMPLANT
DERMABOND ADVANCED (GAUZE/BANDAGES/DRESSINGS) ×1
DERMABOND ADVANCED .7 DNX12 (GAUZE/BANDAGES/DRESSINGS) ×1 IMPLANT
DRAIN CHANNEL 19F RND (DRAIN) IMPLANT
DRAIN HEMOVAC 1/8 X 5 (WOUND CARE) IMPLANT
DRAPE HALF SHEET 70X43 (DRAPES) IMPLANT
DRAPE LAPAROSCOPIC ABDOMINAL (DRAPES) ×2 IMPLANT
DRAPE UTILITY XL STRL (DRAPES) ×2 IMPLANT
DRSG PAD ABDOMINAL 8X10 ST (GAUZE/BANDAGES/DRESSINGS) ×3 IMPLANT
ELECT REM PT RETURN 9FT ADLT (ELECTROSURGICAL) ×2
ELECTRODE REM PT RTRN 9FT ADLT (ELECTROSURGICAL) ×1 IMPLANT
EVACUATOR SILICONE 100CC (DRAIN) IMPLANT
GAUZE 4X4 16PLY RFD (DISPOSABLE) IMPLANT
GAUZE SPONGE 4X4 12PLY STRL (GAUZE/BANDAGES/DRESSINGS) ×2 IMPLANT
GAUZE SPONGE 4X4 12PLY STRL LF (GAUZE/BANDAGES/DRESSINGS) ×2 IMPLANT
GLOVE EXAM NITRILE MD LF STRL (GLOVE) ×1 IMPLANT
GLOVE SS BIOGEL STRL SZ 7 (GLOVE) ×1 IMPLANT
GLOVE SUPERSENSE BIOGEL SZ 7 (GLOVE) ×1
GOWN STRL REUS W/ TWL LRG LVL3 (GOWN DISPOSABLE) ×1 IMPLANT
GOWN STRL REUS W/ TWL XL LVL3 (GOWN DISPOSABLE) ×1 IMPLANT
GOWN STRL REUS W/TWL LRG LVL3 (GOWN DISPOSABLE) ×2
GOWN STRL REUS W/TWL XL LVL3 (GOWN DISPOSABLE) ×2
ILLUMINATOR WAVEGUIDE N/F (MISCELLANEOUS) IMPLANT
KIT MARKER MARGIN INK (KITS) ×2 IMPLANT
LIGHT WAVEGUIDE WIDE FLAT (MISCELLANEOUS) IMPLANT
NDL HYPO 25X1 1.5 SAFETY (NEEDLE) ×1 IMPLANT
NEEDLE HYPO 25X1 1.5 SAFETY (NEEDLE) ×2 IMPLANT
NS IRRIG 1000ML POUR BTL (IV SOLUTION) ×2 IMPLANT
PACK BASIN DAY SURGERY FS (CUSTOM PROCEDURE TRAY) ×2 IMPLANT
PAD ALCOHOL SWAB (MISCELLANEOUS) ×2 IMPLANT
PENCIL BUTTON HOLSTER BLD 10FT (ELECTRODE) ×2 IMPLANT
PIN SAFETY STERILE (MISCELLANEOUS) IMPLANT
SLEEVE SCD COMPRESS KNEE MED (MISCELLANEOUS) ×2 IMPLANT
SPONGE LAP 18X18 RF (DISPOSABLE) ×1 IMPLANT
SPONGE LAP 4X18 RFD (DISPOSABLE) ×3 IMPLANT
STRIP CLOSURE SKIN 1/2X4 (GAUZE/BANDAGES/DRESSINGS) IMPLANT
SUT ETHILON 3 0 FSL (SUTURE) IMPLANT
SUT MNCRL AB 4-0 PS2 18 (SUTURE) ×3 IMPLANT
SUT SILK 2 0 SH (SUTURE) ×3 IMPLANT
SUT VIC AB 2-0 CT1 27 (SUTURE) ×2
SUT VIC AB 2-0 CT1 TAPERPNT 27 (SUTURE) IMPLANT
SUT VIC AB 3-0 SH 27 (SUTURE)
SUT VIC AB 3-0 SH 27X BRD (SUTURE) IMPLANT
SUT VICRYL 3-0 CR8 SH (SUTURE) ×2 IMPLANT
SYR 10ML LL (SYRINGE) ×2 IMPLANT
TOWEL GREEN STERILE FF (TOWEL DISPOSABLE) ×2 IMPLANT
TRAY FAXITRON CT DISP (TRAY / TRAY PROCEDURE) ×4 IMPLANT
TUBE CONNECTING 20X1/4 (TUBING) ×2 IMPLANT
YANKAUER SUCT BULB TIP NO VENT (SUCTIONS) ×2 IMPLANT

## 2019-03-13 NOTE — Anesthesia Preprocedure Evaluation (Signed)
Anesthesia Evaluation  Patient identified by MRN, date of birth, ID band Patient awake    Reviewed: Allergy & Precautions, NPO status , Patient's Chart, lab work & pertinent test results  Airway Mallampati: I  TM Distance: >3 FB Neck ROM: Full    Dental   Pulmonary sleep apnea , former smoker,    Pulmonary exam normal        Cardiovascular Normal cardiovascular exam     Neuro/Psych Depression    GI/Hepatic   Endo/Other    Renal/GU      Musculoskeletal   Abdominal   Peds  Hematology   Anesthesia Other Findings   Reproductive/Obstetrics                             Anesthesia Physical Anesthesia Plan  ASA: III  Anesthesia Plan: General   Post-op Pain Management:    Induction: Intravenous  PONV Risk Score and Plan: 3 and Ondansetron, Midazolam and Treatment may vary due to age or medical condition  Airway Management Planned: LMA  Additional Equipment:   Intra-op Plan:   Post-operative Plan: Extubation in OR  Informed Consent: I have reviewed the patients History and Physical, chart, labs and discussed the procedure including the risks, benefits and alternatives for the proposed anesthesia with the patient or authorized representative who has indicated his/her understanding and acceptance.       Plan Discussed with: CRNA and Surgeon  Anesthesia Plan Comments:         Anesthesia Quick Evaluation

## 2019-03-13 NOTE — Anesthesia Procedure Notes (Signed)
Procedure Name: LMA Insertion Date/Time: 03/13/2019 12:00 PM Performed by: Willa Frater, CRNA Pre-anesthesia Checklist: Patient identified, Emergency Drugs available, Suction available and Patient being monitored Patient Re-evaluated:Patient Re-evaluated prior to induction Oxygen Delivery Method: Circle system utilized Preoxygenation: Pre-oxygenation with 100% oxygen Induction Type: IV induction Ventilation: Mask ventilation without difficulty LMA: LMA inserted LMA Size: 4.0 Number of attempts: 1 Airway Equipment and Method: Bite block Placement Confirmation: positive ETCO2 Tube secured with: Tape Dental Injury: Teeth and Oropharynx as per pre-operative assessment

## 2019-03-13 NOTE — Anesthesia Postprocedure Evaluation (Signed)
Anesthesia Post Note  Patient: [name] Performed: RIGHT  BREAST LUMPECTOMY WITH RADIOACTIVE SEED LOCALIZATION (Right Breast) RIGHT BREAST LUMPECTOMY WITH NEEDLE LOCALIZATION X2 (Right Breast)     Patient location during evaluation: PACU Anesthesia Type: General Level of consciousness: awake and alert Pain management: pain level controlled Vital Signs Assessment: post-procedure vital signs reviewed and stable Respiratory status: spontaneous breathing, nonlabored ventilation, respiratory function stable and patient connected to nasal cannula oxygen Cardiovascular status: blood pressure returned to baseline and stable Postop Assessment: no apparent nausea or vomiting Anesthetic complications: no    Last Vitals:  Vitals:   03/13/19 1415 03/13/19 1430  BP: (!) 126/94 111/90  Pulse: 91 96  Resp: 15 17  Temp:    SpO2: 100% 100%    Last Pain:  Vitals:   03/13/19 1430  TempSrc:   PainSc: 4                  Rashel Okeefe DAVID

## 2019-03-13 NOTE — Discharge Instructions (Signed)
[place] Office Phone Number [phone]  BREAST BIOPSY/ PARTIAL MASTECTOMY: POST OP INSTRUCTIONS  Always review your discharge instruction sheet given to you by the facility where your surgery was performed.  IF YOU HAVE DISABILITY OR FAMILY LEAVE FORMS, YOU MUST BRING THEM TO THE OFFICE FOR PROCESSING.  DO NOT GIVE THEM TO YOUR DOCTOR.  1. A prescription for pain medication may be given to you upon discharge.  Take your pain medication as prescribed, if needed.  If narcotic pain medicine is not needed, then you may take acetaminophen (Tylenol) or ibuprofen (Advil) as needed. 2. Take your usually prescribed medications unless otherwise directed 3. If you need a refill on your pain medication, please contact your pharmacy.  They will contact our office to request authorization.  Prescriptions will not be filled after 5pm or on week-ends. 4. You should eat very light the first 24 hours after surgery, such as soup, crackers, pudding, etc.  Resume your normal diet the day after surgery. 5. Most patients will experience some swelling and bruising in the breast.  Ice packs and a good support bra will help.  Swelling and bruising can take several days to resolve.  6. It is common to experience some constipation if taking pain medication after surgery.  Increasing fluid intake and taking a stool softener will usually help or prevent this problem from occurring.  A mild laxative (Milk of Magnesia or Miralax) should be taken according to package directions if there are no bowel movements after 48 hours. 7. Unless discharge instructions indicate otherwise, you may remove your bandages 24-48 hours after surgery, and you may shower at that time.  You may have steri-strips (small skin tapes) in place directly over the incision.  These strips should be left on the skin for 7-10 days.  If your surgeon used skin glue on the incision, you may shower in 24 hours.  The glue will flake off over the  next 2-3 weeks.  Any sutures or staples will be removed at the office during your follow-up visit. 8. ACTIVITIES:  You may resume regular daily activities (gradually increasing) beginning the next day.  Wearing a good support bra or sports bra minimizes pain and swelling.  You may have sexual intercourse when it is comfortable. a. You may drive when you no longer are taking prescription pain medication, you can comfortably wear a seatbelt, and you can safely maneuver your car and apply brakes. b. RETURN TO WORK:  ______________________________________________________________________________________ 9. You should see your doctor in the office for a follow-up appointment approximately two weeks after your surgery.  Your doctors nurse will typically make your follow-up appointment when she calls you with your pathology report.  Expect your pathology report 2-3 business days after your surgery.  You may call to check if you do not hear from [place] after three days. 10. OTHER INSTRUCTIONS: _______________________________________________________________________________________________ _____________________________________________________________________________________________________________________________________ _____________________________________________________________________________________________________________________________________ _____________________________________________________________________________________________________________________________________  WHEN TO CALL YOUR DOCTOR: 1. Fever over 101.0 2. Nausea and/or vomiting. 3. Extreme swelling or bruising. 4. Continued bleeding from incision. 5. Increased pain, redness, or drainage from the incision.  The clinic staff is available to answer your questions during regular business hours.  Please dont hesitate to call and ask to speak to one of the nurses for clinical concerns.  If you have a medical emergency, go to the nearest  emergency room or call 911.  A surgeon from [facility] is always on call at the hospital.  For further questions, please visit [email or web address]  Managing Your Pain After Surgery Without Opioids    Thank you for participating in our program to help patients manage their pain after surgery without opioids. This is part of our effort to provide you with the best care possible, without exposing you or your family to the risk that opioids pose.  What pain can I expect after surgery? You can expect to have some pain after surgery. This is normal. The pain is typically worse the day after surgery, and quickly begins to get better. Many studies have found that many patients are able to manage their pain after surgery with Over-the-Counter (OTC) medications such as Tylenol and Motrin. If you have a condition that does not allow you to take Tylenol or Motrin, notify your surgical team.  How will I manage my pain? The best strategy for controlling your pain after surgery is around the clock pain control with Tylenol (acetaminophen) and Motrin (ibuprofen or Advil). Alternating these medications with each other allows you to maximize your pain control. In addition to Tylenol and Motrin, you can use heating pads or ice packs on your incisions to help reduce your pain.  How will I alternate your regular strength over-the-counter pain medication? You will take a dose of pain medication every three hours. ; Start by taking 650 mg of Tylenol (2 pills of 325 mg) ; 3 hours later take 600 mg of Motrin (3 pills of 200 mg) ; 3 hours after taking the Motrin take 650 mg of Tylenol ; 3 hours after that take 600 mg of Motrin.   - 1 -  See example - if your first dose of Tylenol is at 12:00 PM   12:00 PM Tylenol 650 mg (2 pills of 325 mg)  3:00 PM Motrin 600 mg (3 pills of 200 mg)  6:00 PM Tylenol 650 mg (2 pills of 325 mg)  9:00 PM Motrin 600 mg (3 pills  of 200 mg)  Continue alternating every 3 hours   We recommend that you follow this schedule around-the-clock for at least 3 days after surgery, or until you feel that it is no longer needed. Use the table on the last page of this handout to keep track of the medications you are taking. Important: Do not take more than 3000mg  of Tylenol or 3200mg  of Motrin in a 24-hour period. Do not take ibuprofen/Motrin if you have a history of bleeding stomach ulcers, severe kidney disease, &/or actively taking a blood thinner  What if I still have pain? If you have pain that is not controlled with the over-the-counter pain medications (Tylenol and Motrin or Advil) you might have what we call breakthrough pain. You will receive a prescription for a small amount of an opioid pain medication such as Oxycodone, Tramadol, or Tylenol with Codeine. Use these opioid pills in the first 24 hours after surgery if you have breakthrough pain. Do not take more than 1 pill every 4-6 hours.  If you still have uncontrolled pain after using all opioid pills, don't hesitate to call our staff using the number provided. We will help make sure you are managing your pain in the best way possible, and if necessary, we can provide a prescription for additional pain medication.   Day 1    Time  Name of Medication Number of pills taken  Amount of Acetaminophen  Pain Level   Comments  AM PM       AM PM       AM PM  AM PM       AM PM       AM PM       AM PM       AM PM       Total Daily amount of Acetaminophen Do not take more than  3,000 mg per day      Day 2    Time  Name of Medication Number of pills taken  Amount of Acetaminophen  Pain Level   Comments  AM PM       AM PM       AM PM       AM PM       AM PM       AM PM       AM PM       AM PM       Total Daily amount of Acetaminophen Do not take more than  3,000 mg per day      Day 3    Time  Name of Medication Number of pills taken  Amount  of Acetaminophen  Pain Level   Comments  AM PM       AM PM       AM PM       AM PM          AM PM       AM PM       AM PM       AM PM       Total Daily amount of Acetaminophen Do not take more than  3,000 mg per day      Day 4    Time  Name of Medication Number of pills taken  Amount of Acetaminophen  Pain Level   Comments  AM PM       AM PM       AM PM       AM PM       AM PM       AM PM       AM PM       AM PM       Total Daily amount of Acetaminophen Do not take more than  3,000 mg per day      Day 5    Time  Name of Medication Number of pills taken  Amount of Acetaminophen  Pain Level   Comments  AM PM       AM PM       AM PM       AM PM       AM PM       AM PM       AM PM       AM PM       Total Daily amount of Acetaminophen Do not take more than  3,000 mg per day       Day 6    Time  Name of Medication Number of pills taken  Amount of Acetaminophen  Pain Level  Comments  AM PM       AM PM       AM PM       AM PM       AM PM       AM PM       AM PM       AM PM       Total Daily amount of Acetaminophen Do not take more than  3,000 mg per day      Day 7    Time  Name of Medication Number of pills taken  Amount of Acetaminophen  Pain Level   Comments  AM PM       AM PM       AM PM       AM PM       AM PM       AM PM       AM PM       AM PM       Total Daily amount of Acetaminophen Do not take more than  3,000 mg per day        For additional information about how and where to safely dispose of unused opioid medications - [employer]  Disclaimer: This document contains information and/or instructional materials adapted from [place] for the typical patient with your condition. It does not replace medical advice from your health care provider because your experience may differ from that of the typical patient. Talk to your health care provider if you have any questions about  this document, your condition or your treatment plan. Adapted from [facility] Instructions  Activity: Get plenty of rest for the remainder of the day. A responsible individual must stay with you for 24 hours following the procedure.  For the next 24 hours, DO NOT: -Drive a car -Paediatric nurse -Drink alcoholic beverages -Take any medication unless instructed by your physician -Make any legal decisions or sign important papers.  Meals: Start with liquid foods such as gelatin or soup. Progress to regular foods as tolerated. Avoid greasy, spicy, heavy foods. If nausea and/or vomiting occur, drink only clear liquids until the nausea and/or vomiting subsides. Call your physician if vomiting continues.  Special Instructions/Symptoms: Your throat may feel dry or sore from the anesthesia or the breathing tube placed in your throat during surgery. If this causes discomfort, gargle with warm salt water. The discomfort should disappear within 24 hours.    If you had a scopolamine patch placed behind your ear for the management of post- operative nausea and/or vomiting:  1. The medication in the patch is effective for 72 hours, after which it should be removed.  Wrap patch in a tissue and discard in the trash. Wash hands thoroughly with soap and water. 2. You may remove the patch earlier than 72 hours if you experience unpleasant side effects which may include dry mouth, dizziness or visual disturbances. 3. Avoid touching the patch. Wash your hands with soap and water after contact with the patch.   Information for Discharge Teaching: EXPAREL (bupivacaine liposome injectable suspension)   Your surgeon or anesthesiologist gave you EXPAREL(bupivacaine) to help control your pain after surgery.   EXPAREL is a local anesthetic that provides pain relief by numbing the tissue around the surgical site.  EXPAREL is designed to release pain medication over  time and can control pain for up to 72 hours.  Depending on how you respond to EXPAREL, you may require less pain medication during your recovery.  Possible side effects:  Temporary loss of sensation or ability to move in the area where bupivacaine was injected.  Nausea, vomiting, constipation  Rarely, numbness and tingling in your mouth or lips, lightheadedness, or anxiety may occur.  Call your doctor right away if you think you may be experiencing any of these sensations, or if you have other questions regarding possible side effects.  Follow all other discharge instructions given to you by your surgeon or nurse. Eat a healthy diet and drink plenty of water or other fluids.  If you return to the hospital for any reason within 96 hours following the administration of EXPAREL, it is important for health care providers to know that you have received this anesthetic. A teal colored band has been placed on your arm with the date, time and amount of EXPAREL you have received in order to alert and inform your health care providers. Please leave this armband in place for the full 96 hours following administration, and then you may remove the band.   No tylenol until after 4:20pm today.  No ibuprofen until after 6:20 today.

## 2019-03-13 NOTE — Interval H&P Note (Signed)
History and Physical Interval Note:  03/13/2019 9:52 AM  [name]  has presented today for surgery, with the diagnosis of multiple papillomas.  The various methods of treatment have been discussed with the patient and family. After consideration of risks, benefits and other options for treatment, the patient has consented to  Procedure(s): LEFT BREAST LUMPECTOMY WITH RADIOACTIVE SEED LOCALIZATION (Left) LEFT BREAST LUMPECTOMY WITH NEEDLE LOCALIZATION X2 (Left) as a surgical intervention.  The patient's history has been reviewed, patient examined, no change in status, stable for surgery.  I have reviewed the patient's chart and labs.  Questions were answered to the patient's satisfaction.     Kristy Stafford

## 2019-03-13 NOTE — Op Note (Signed)
Patient Name:           [facility]   Date of Surgery:        03/13/2019  Pre op Diagnosis:      Multiple papillomas, right breast                                      Family history breast cancer                                      Remote history left breast biopsy  Post op Diagnosis:    Same  Procedure:                 Left breast lumpectomy with radioactive seed localization, 1 o'clock position                                      Left breast lumpectomy with wire localization, retroareolar, 3 o'clock position                                      Left breast lumpectomy with wire localization, areolar margin, 7 o'clock position                                      Reexcision left breast tissue, areolar margin, 7 o'clock position  Surgeon:                     Edsel Petrin. Dalbert Batman, M.D., FACS  Assistant:                      OR staff  Operative Indications:  This is a 43 year old female who is brought to the operating room for excision of multiple papillomas of the right breast. I have discussed her case with Dr. Melanee Spry, Dr. Dorise Bullion, Dr. Nolon Nations, and Dr. Curlene Dolphin at the [facility]. Beatrix Shipper is her PCP. Dr. Quincy Simmonds is her gynecologist.       3 years ago she presented with bloody left nipple discharge. Left retroareolar excision by Dr. Margot Chimes revealed benign papilloma and the discharge resolved.      She recently had screening mammograms in several findings were identified. She also had an ultrasound and MRI and multiple procedures First, there was a small intraductal mass in the right breast at the areolar margin at the 7 o'clock position. Biopsy showed dilated ducts, felt to be discordant, excision recommended. Curlene Dolphin thinks this is a papilloma. Second,Also noted was a complex mass in the right breast at the 1 o'clock position, several centimeters away from the areolar margin, biopsy showed papilloma and excision was recommended. Third, Abnormal right axillary lymph node  was biopsied and showed benign lymphatic tissue was felt to be concordant Left breast was felt to be normal Breast MRI was performed and showed biopsy changes in the right retroareolar area. The biopsy clip at the 7 o'clock position had migrated incision Turner put another clip in and was felt to be well positioned Fourth, Dr. Theodosia Blender identified another mass in the right  breast at the areolar margin at the 3 o'clock position. This was biopsied and showed papilloma. Fifth, Non-mass enhancement of left breast was biopsied and showed fibrocystic change, benign, concordant.       Past history significant for anxiety and left breast biopsy for papilloma Family history positive for mother dying age 15 of metastatic breast cancer. Maternal grandmother had breast cancer at 16. Father living with diabetes. 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 I told her all 3 areas need to be excised and she agrees. Postop she needs to be referred for genetics Postop she is to be referred for high-risk breast clinic      We planned the excisions with radiology.  The area at 7:00 and the area at 3:00 are very superficial so we felt wire localization will be the best.  The area at 1:00 was deep earlier in the breast and we felt that radioactive seed would be appropriate.  That was done and was helpful.. I discussed the indications, details, techniques, and numerous risk of the surgery with her. She agrees with this plan.   Operative Findings:       The localized papilloma in the right breast at the 1 o'clock position was actually deep to the areolar margin.  The localized papilloma in the right breast at the 3 o'clock position was localized with a wire, and the localized discordant biopsy at  the 7 o'clock position was localized with a wire.  I made 2 incisions at the areolar margin, 1 in the superior medial breast and another at the 7 o'clock position.  We had 3 separate lumpectomy specimens and in each specimen we saw the intact wire and the original biopsy clip.  Radiology felt this was appropriate.  I also excised a little bit of extra tissue at the 7 o'clock position  Procedure in Detail:          Following the induction of general LMA anesthesia the patient's right breast was prepped and draped in a sterile fashion.  Surgical timeout was performed.  Intravenous antibiotics were given.  0.5% Marcaine was used as a local infiltration anesthetic.    I initially used the neoprobe and found that the radioactive signal was centrally located at the superior areolar margin of the right breast at the 1 o'clock position.  This allowed [place] to make a single incision in the upper and medial breast areolar margin to get both the seed out and the medial papilloma out.        The specimen with the radioactive seed at 1:00 was excised using the neoprobe and cautery.  Sutures were placed to mark the margins.  Specimen mammogram looked good containing the marker clip and the seed.    The second area excised was at 3 o'clock position and we did that through the same incision excising the entirety of the wire.  This was not oriented.  The wire and the breast tissue were imaged showing the intact wire and the biopsy clip.     The third area was excised at the 7 o'clock position with a circumareolar scar at the 7 o'clock position.  The lumpectomy was performed.  Again, orientation of the specimen was not possible.  The wire was removed intact.  The specimen showed the biopsy clip.  I thought it was a little bit close so I reexcised a little bit of tissue at the 7 o'clock position and sent that as a separate specimen.    Radiology stated that all 3 areas had the appropriate clip markings within the specimens.     Hemostasis was excellent.  The wounds were irrigated.  Metal marker clips were placed in the walls of both lumpectomy cavities.  The tissues were reapproximated with 2-0 and 3-0 Vicryl and the skin incisions closed with subcuticular 4-0 Monocryl and Dermabond.  Dry bulky bandages were placed and held in place with a breast binder.  The patient tolerated the procedure well and was taken to PACU in stable condition.  EBL 30 cc.  Counts correct.  Complications none.    Addendum: I logged onto the PMP aware website and reviewed her prescription medication history     Parisha Beaulac M. Dalbert Batman, M.D., FACS General and Minimally Invasive Surgery Breast and Colorectal Surgery  03/13/2019 1:36 PM \

## 2019-03-13 NOTE — Transfer of Care (Signed)
Immediate Anesthesia Transfer of Care Note  Patient: Kristy Stafford  Procedure(s) Performed: RIGHT  BREAST LUMPECTOMY WITH RADIOACTIVE SEED LOCALIZATION (Right Breast) RIGHT BREAST LUMPECTOMY WITH NEEDLE LOCALIZATION X2 (Right Breast)  Patient Location: PACU  Anesthesia Type:General  Level of Consciousness: sedated  Airway & Oxygen Therapy: Patient Spontanous Breathing and Patient connected to face mask oxygen  Post-op Assessment: Report given to RN and Post -op Vital signs reviewed and stable  Post vital signs: Reviewed and stable  Last Vitals:  Vitals Value Taken Time  BP    Temp    Pulse    Resp 19 03/13/19 1349  SpO2    Vitals shown include unvalidated device data.  Last Pain:  Vitals:   03/13/19 1004  TempSrc: Oral  PainSc: 0-No pain         Complications: No apparent anesthesia complications

## 2019-03-16 ENCOUNTER — Encounter (HOSPITAL_BASED_OUTPATIENT_CLINIC_OR_DEPARTMENT_OTHER): Payer: Self-pay | Admitting: General Surgery

## 2019-03-16 LAB — SURGICAL PATHOLOGY

## 2019-03-18 ENCOUNTER — Encounter: Payer: Self-pay | Admitting: Obstetrics and Gynecology

## 2019-03-18 ENCOUNTER — Telehealth: Payer: Self-pay | Admitting: Obstetrics and Gynecology

## 2019-03-18 ENCOUNTER — Other Ambulatory Visit: Payer: Self-pay

## 2019-03-18 ENCOUNTER — Ambulatory Visit (INDEPENDENT_AMBULATORY_CARE_PROVIDER_SITE_OTHER): Payer: 59 | Admitting: Obstetrics and Gynecology

## 2019-03-18 VITALS — BP 106/62 | HR 88 | Temp 97.6°F | Ht 63.25 in | Wt 203.0 lb

## 2019-03-18 DIAGNOSIS — Z8742 Personal history of other diseases of the female genital tract: Secondary | ICD-10-CM | POA: Diagnosis not present

## 2019-03-18 DIAGNOSIS — Z9889 Other specified postprocedural states: Secondary | ICD-10-CM

## 2019-03-18 DIAGNOSIS — Z3169 Encounter for other general counseling and advice on procreation: Secondary | ICD-10-CM | POA: Diagnosis not present

## 2019-03-18 NOTE — Telephone Encounter (Signed)
Call to [facility] Pathology- per Suanne Marker, slides not yet received but have received required authorization to obtain slides.  Can call back to [phone] for follow-up.

## 2019-03-18 NOTE — Progress Notes (Signed)
GYNECOLOGY  VISIT   HPI: 43 y.o.   Married  [place] American  female   [phone] with No LMP recorded. (Menstrual status: Other).   here for 2 week follow up DILATATION & CURETTAGE/HYSTEROSCOPY (N/A Uterus).  Final pathology showed benign secretory endometrium.  Her preop endometrial biopsy showed complex endometrial hyperplasia without atypia.   Just stopped bleeding 3 days ago. She had some cramping after her surgery.  Right breast biopsy showed intraductal papilloma ductal hyperplasia, and fibrocystic change.  Has considered childbearing and freezing her eggs.   GYNECOLOGIC HISTORY: No LMP recorded. (Menstrual status: Other). Contraception:  Abstinence Menopausal hormone therapy:  n/a Last mammogram: 12-17-18---See Epic Last pap smear:   12/11/18 Neg:Neg HR HPV        OB History    Gravida  3   Para      Term      Preterm      AB  3   Living        SAB  3   TAB      Ectopic      Multiple      Live Births                 Patient Active Problem List   Diagnosis Date Noted  . 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 Anemia   . Breast mass, left 10/10/2012   Excised 11/24/12, papilloma on path   . 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 Depression   . 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 High anti-Mullerian hormone 2020  . Hyperlipidemia   . Medical history non-contributory   . Nipple discharge    left breast  . PCOS (polycystic ovarian syndrome)   . Sleep apnea    has a cpap for 3 yr-told she will need to bring day of surgery and use post op    Past Surgical History:  Procedure Laterality Date  . BREAST DUCTAL SYSTEM EXCISION Left 11/24/2012   Procedure: EXCISION DUCTAL SYSTEM BREAST;  Surgeon: Haywood Lasso, MD;  Location: [facility];  Service: General;  Laterality: Left;  . BREAST EXCISIONAL  BIOPSY Left 2014  . BREAST LUMPECTOMY WITH NEEDLE LOCALIZATION Right 03/13/2019   Procedure: RIGHT BREAST LUMPECTOMY WITH NEEDLE LOCALIZATION X2;  Surgeon: Fanny Skates, MD;  Location: [facility];  Service: General;  Laterality: Right;  . BREAST LUMPECTOMY WITH RADIOACTIVE SEED LOCALIZATION Right 03/13/2019   Procedure: RIGHT  BREAST LUMPECTOMY WITH RADIOACTIVE SEED LOCALIZATION;  Surgeon: Fanny Skates, MD;  Location: [place];  Service: General;  Laterality: Right;  . DILATATION & CURETTAGE/HYSTEROSCOPY WITH MYOSURE N/A 03/03/2019   Procedure: DILATATION & CURETTAGE/HYSTEROSCOPY;  Surgeon: Nunzio Cobbs, MD;  Location: [facility];  Service: Gynecology;  Laterality: N/A;  . NO PAST SURGERIES      Current Outpatient Medications  Medication Sig Dispense Refill  . HYDROcodone-acetaminophen (NORCO) 5-325 MG tablet Take 1-2 tablets by mouth every 6 (six) hours as needed for moderate pain or severe pain. 30 tablet 0  . ibuprofen (ADVIL) 800 MG tablet Take 1 tablet (800 mg total) by mouth every 8 (eight) hours as needed. 30 tablet 0   No current facility-administered medications for this visit.      ALLERGIES: Patient has no known allergies.  Family History  Problem Relation Age of Onset  . Cancer  Mother 24       breast  . Breast cancer Mother   . Diabetes Father   . Cancer Maternal Grandmother        breast--dec age 29  . Breast cancer Maternal Grandmother   . Ovarian cancer Maternal Aunt        deceased  . Ovarian cancer Paternal Grandmother     Social History   Socioeconomic History  . Marital status: Married    Spouse name: Not on file  . Number of children: Not on file  . Years of education: Not on file  . Highest education level: Not on file  Occupational History  . Not on file  Social Needs  . Financial resource strain: Not on file  . Food insecurity    Worry: Not on file    Inability: Not on file  .  Transportation needs    Medical: Not on file    Non-medical: Not on file  Tobacco Use  . Smoking status: Former Research scientist (life sciences)  . Smokeless tobacco: Never Used  . Tobacco comment: per patient smokes hemp  Substance and Sexual Activity  . Alcohol use: Not Currently  . Drug use: No  . Sexual activity: Not Currently    Partners: Male    Birth control/protection: None, Abstinence  Lifestyle  . Physical activity    Days per week: Not on file    Minutes per session: Not on file  . Stress: Not on file  Relationships  . Social Herbalist on phone: Not on file    Gets together: Not on file    Attends religious service: Not on file    Active member of club or organization: Not on file    Attends meetings of clubs or organizations: Not on file    Relationship status: Not on file  . Intimate partner violence    Fear of current or ex partner: Not on file    Emotionally abused: Not on file    Physically abused: Not on file    Forced sexual activity: Not on file  Other Topics Concern  . Not on file  Social History Narrative   Single, no children   Right handed   Some college   < 1 cup daily    Review of Systems  All other systems reviewed and are negative.   PHYSICAL EXAMINATION:    BP 106/62 (Cuff Size: Large)   Pulse 88   Temp 97.6 F (36.4 C) (Temporal)   Ht 5' 3.25" (1.607 m)   Wt 203 lb (92.1 kg)   BMI 35.68 kg/m     General appearance: alert, cooperative and appears stated age   Pelvic: External genitalia:  no lesions              Urethra:  normal appearing urethra with no masses, tenderness or lesions              Bartholins and Skenes: normal                 Vagina: normal appearing vagina with normal color and discharge, no lesions              Cervix: no lesions                Bimanual Exam:  Uterus:  normal size, contour, position, consistency, mobility, non-tender              Adnexa: no mass, fullness, tenderness  Chaperone was present for  exam.  ASSESSMENT  Status post hysteroscopy with dilation and curettage.  Discordance between preop endometrial biopsy and surgical pathology.  Long hx of oligomenorrhea and PCOS. AMA status.   PLAN  Awaiting re-read for the preop endometrial biopsy.  Dr. Lyndon Code coordinating this.  Will have our office recheck on the status of this re-read.  Patient and I discussed options for treating oligomenorrhea and endometrial hyperplasia.  We discussed progesterone treatment and focused heavily on Mirena IUD.  I discussed risks and benefits of Mirena in detail.  We also discussed PCOS and effect on fertility.  We reviewed her AMA status and assisted reproductive technology.  I discussed the benefits of weight loss for improving fertility, reducing risk of endometrial hyperplasia,endometrial cancer, hypertension, adult onset diabetes, and cardiovascular disease. FU likely for Mirena IUD insertion.    An After Visit Summary was printed and given to the patient.  __25____ minutes face to face time of which over 50% was spent in counseling.

## 2019-03-18 NOTE — Telephone Encounter (Signed)
Please contact [facility] in follow up to a request to review outside pathology slides from [employer] [facility].   I spoke with Dr. Lyndon Code, pathologist about re-reading the patient's endometrial biopsy from 01/01/19 which showed complex endometrial hyperplasia without atypia.    He read her surgical specimen from 03/03/19 as benign secretory endometrium.   Dr. Lyndon Code was going to request the slides from [facility] and then issue a reading from them, due to the discordance from the two specimens.

## 2019-03-20 ENCOUNTER — Encounter: Payer: Self-pay | Admitting: Obstetrics and Gynecology

## 2019-03-30 LAB — SURGICAL PATHOLOGY

## 2019-03-31 NOTE — Telephone Encounter (Signed)
Call to [facility] at [phone]. Rhonda states Dr Lyndon Code spoke with Dr Quincy Simmonds personally on 03-30-19.  Encounter closed.

## 2019-04-02 ENCOUNTER — Other Ambulatory Visit: Payer: Self-pay | Admitting: Orthopedic Surgery

## 2019-04-08 ENCOUNTER — Encounter: Payer: Self-pay | Admitting: Hematology and Oncology

## 2019-04-08 ENCOUNTER — Telehealth: Payer: Self-pay | Admitting: Hematology and Oncology

## 2019-04-08 NOTE — Telephone Encounter (Signed)
Received a new patient referral from Dr. Dalbert Batman at [facility] for Ms. Scholten to be seen in the high risk clinic. Ms. Pulsifer has been cld and scheduled to see Dr. Lindi Adie on 12/2 at 1pm. She's been made aware to arrive 15 minutes. Letter mailed.

## 2019-04-13 ENCOUNTER — Encounter (HOSPITAL_BASED_OUTPATIENT_CLINIC_OR_DEPARTMENT_OTHER): Payer: Self-pay | Admitting: *Deleted

## 2019-04-13 ENCOUNTER — Other Ambulatory Visit: Payer: Self-pay

## 2019-04-16 ENCOUNTER — Telehealth: Payer: Self-pay | Admitting: Obstetrics and Gynecology

## 2019-04-16 ENCOUNTER — Encounter (HOSPITAL_BASED_OUTPATIENT_CLINIC_OR_DEPARTMENT_OTHER)
Admission: RE | Admit: 2019-04-16 | Discharge: 2019-04-16 | Disposition: A | Discharge status: Home / Self Care | Payer: 59 | Source: Ambulatory Visit | Attending: Orthopedic Surgery | Admitting: Orthopedic Surgery

## 2019-04-16 ENCOUNTER — Other Ambulatory Visit: Payer: Self-pay

## 2019-04-16 ENCOUNTER — Other Ambulatory Visit (HOSPITAL_COMMUNITY)
Admission: RE | Admit: 2019-04-16 | Discharge: 2019-04-16 | Disposition: A | Discharge status: Home / Self Care | Payer: 59 | Source: Ambulatory Visit | Attending: Orthopedic Surgery | Admitting: Orthopedic Surgery

## 2019-04-16 DIAGNOSIS — Z01812 Encounter for preprocedural laboratory examination: Secondary | ICD-10-CM | POA: Insufficient documentation

## 2019-04-16 DIAGNOSIS — Z20828 Contact with and (suspected) exposure to other viral communicable diseases: Secondary | ICD-10-CM | POA: Diagnosis not present

## 2019-04-16 DIAGNOSIS — Z8742 Personal history of other diseases of the female genital tract: Secondary | ICD-10-CM

## 2019-04-16 DIAGNOSIS — Z30014 Encounter for initial prescription of intrauterine contraceptive device: Secondary | ICD-10-CM

## 2019-04-16 LAB — POCT PREGNANCY, URINE: Preg Test, Ur: NEGATIVE

## 2019-04-16 NOTE — Telephone Encounter (Signed)
Left message to call Sharee Pimple, RN at [facility].   Per review of Epic, Mirena IUD discussed at 03/18/19 OV.

## 2019-04-16 NOTE — Telephone Encounter (Signed)
Patient is returning call to Jill, RN.  °

## 2019-04-16 NOTE — Telephone Encounter (Signed)
Patient is calling to proceed with scheduling Mirena IUD insertion.

## 2019-04-16 NOTE — Telephone Encounter (Signed)
Spoke with patient. Patient request to proceed with Mirena IUD insertion previously discussed with Dr. Quincy Simmonds. Hx of irregular menses, LMP approximately 02/26/19. No contraceptive. Has not been SA since August 2020. Advised patient to continue to abstain from intercourse, can do UPT day of procedure.   IUD insertion scheduled for 05/12/19 at 2:30pm with Dr. Quincy Simmonds. Advised to take Motrin 800 mg with food and water one hour before procedure.   Advised I will review with Dr. Quincy Simmonds and return call if any additional recommendations. Patient agreeable.    Dr. Quincy Simmonds -please review. OK to proceed as scheduled? Can you confirm Dx for Mirena IUD?

## 2019-04-16 NOTE — Telephone Encounter (Signed)
She needs contraception and she also has a history of endometrial hyperplasia.

## 2019-04-16 NOTE — Progress Notes (Signed)

## 2019-04-17 LAB — NOVEL CORONAVIRUS, NAA (HOSP ORDER, SEND-OUT TO REF LAB; TAT 18-24 HRS): SARS-CoV-2, NAA: NOT DETECTED

## 2019-04-17 NOTE — Telephone Encounter (Signed)
Order placed for IUD insertion.   Routing to [employer]  for [employer].  Encounter closed.

## 2019-04-20 ENCOUNTER — Ambulatory Visit (HOSPITAL_BASED_OUTPATIENT_CLINIC_OR_DEPARTMENT_OTHER): Payer: No Typology Code available for payment source | Admitting: Certified Registered"

## 2019-04-20 ENCOUNTER — Other Ambulatory Visit: Payer: Self-pay

## 2019-04-20 ENCOUNTER — Ambulatory Visit (HOSPITAL_BASED_OUTPATIENT_CLINIC_OR_DEPARTMENT_OTHER)
Admission: RE | Admit: 2019-04-20 | Discharge: 2019-04-20 | Disposition: A | Discharge status: Home / Self Care | Payer: No Typology Code available for payment source | Attending: Orthopedic Surgery | Admitting: Orthopedic Surgery

## 2019-04-20 ENCOUNTER — Encounter (HOSPITAL_BASED_OUTPATIENT_CLINIC_OR_DEPARTMENT_OTHER): Payer: Self-pay

## 2019-04-20 ENCOUNTER — Encounter (HOSPITAL_BASED_OUTPATIENT_CLINIC_OR_DEPARTMENT_OTHER)
Admission: RE | Disposition: A | Discharge status: Home / Self Care | Payer: Self-pay | Source: Home / Self Care | Attending: Orthopedic Surgery

## 2019-04-20 DIAGNOSIS — G4730 Sleep apnea, unspecified: Secondary | ICD-10-CM | POA: Diagnosis not present

## 2019-04-20 DIAGNOSIS — E282 Polycystic ovarian syndrome: Secondary | ICD-10-CM | POA: Diagnosis not present

## 2019-04-20 DIAGNOSIS — G5601 Carpal tunnel syndrome, right upper limb: Secondary | ICD-10-CM | POA: Insufficient documentation

## 2019-04-20 DIAGNOSIS — Z87891 Personal history of nicotine dependence: Secondary | ICD-10-CM | POA: Diagnosis not present

## 2019-04-20 HISTORY — PX: CARPAL TUNNEL RELEASE: SHX101

## 2019-04-20 SURGERY — CARPAL TUNNEL RELEASE
Anesthesia: General | Site: Wrist | Laterality: Right

## 2019-04-20 MED ORDER — FENTANYL CITRATE (PF) 100 MCG/2ML IJ SOLN
INTRAMUSCULAR | Status: DC | PRN
  Administered 2019-04-20 (×2): 50 ug via INTRAVENOUS

## 2019-04-20 MED ORDER — MIDAZOLAM HCL 2 MG/2ML IJ SOLN
INTRAMUSCULAR | Status: AC
  Filled 2019-04-20: qty 2

## 2019-04-20 MED ORDER — HYDROCODONE-ACETAMINOPHEN 5-325 MG PO TABS: ORAL_TABLET | ORAL | 0 refills | Status: DC

## 2019-04-20 MED ORDER — OXYCODONE HCL 5 MG PO TABS
ORAL_TABLET | ORAL | Status: AC
  Filled 2019-04-20: qty 1

## 2019-04-20 MED ORDER — LIDOCAINE 2% (20 MG/ML) 5 ML SYRINGE
INTRAMUSCULAR | Status: AC
  Filled 2019-04-20: qty 5

## 2019-04-20 MED ORDER — FENTANYL CITRATE (PF) 100 MCG/2ML IJ SOLN
INTRAMUSCULAR | Status: AC
  Filled 2019-04-20: qty 2

## 2019-04-20 MED ORDER — BUPIVACAINE HCL (PF) 0.25 % IJ SOLN
INTRAMUSCULAR | Status: AC
  Filled 2019-04-20: qty 60

## 2019-04-20 MED ORDER — DEXAMETHASONE SODIUM PHOSPHATE 10 MG/ML IJ SOLN
INTRAMUSCULAR | Status: AC
  Filled 2019-04-20: qty 1

## 2019-04-20 MED ORDER — DEXMEDETOMIDINE HCL IN NACL 200 MCG/50ML IV SOLN
INTRAVENOUS | Status: AC
  Filled 2019-04-20: qty 50

## 2019-04-20 MED ORDER — PROPOFOL 500 MG/50ML IV EMUL
INTRAVENOUS | Status: DC | PRN
  Administered 2019-04-20: 50 ug/kg/min via INTRAVENOUS

## 2019-04-20 MED ORDER — PROPOFOL 500 MG/50ML IV EMUL
INTRAVENOUS | Status: AC
  Filled 2019-04-20: qty 50

## 2019-04-20 MED ORDER — OXYCODONE HCL 5 MG PO TABS
5.0000 mg | ORAL_TABLET | Freq: Once | ORAL | Status: AC
  Administered 2019-04-20: 5 mg via ORAL

## 2019-04-20 MED ORDER — ONDANSETRON HCL 4 MG/2ML IJ SOLN
INTRAMUSCULAR | Status: DC | PRN
  Administered 2019-04-20: 4 mg via INTRAVENOUS

## 2019-04-20 MED ORDER — PROPOFOL 10 MG/ML IV BOLUS
INTRAVENOUS | Status: DC | PRN
  Administered 2019-04-20: 200 mg via INTRAVENOUS

## 2019-04-20 MED ORDER — DEXAMETHASONE SODIUM PHOSPHATE 10 MG/ML IJ SOLN
INTRAMUSCULAR | Status: DC | PRN
  Administered 2019-04-20: 10 mg via INTRAVENOUS

## 2019-04-20 MED ORDER — CEFAZOLIN SODIUM-DEXTROSE 2-4 GM/100ML-% IV SOLN
INTRAVENOUS | Status: AC
  Filled 2019-04-20: qty 100

## 2019-04-20 MED ORDER — CHLORHEXIDINE GLUCONATE 4 % EX LIQD
60.0000 mL | Freq: Once | CUTANEOUS | Status: AC
  Administered 2019-04-20: 4 via TOPICAL

## 2019-04-20 MED ORDER — LACTATED RINGERS IV SOLN
INTRAVENOUS | Status: DC
  Administered 2019-04-20: 12:00:00 via INTRAVENOUS

## 2019-04-20 MED ORDER — PROMETHAZINE HCL 25 MG/ML IJ SOLN: 6.2500 mg | INTRAMUSCULAR | Status: DC | PRN

## 2019-04-20 MED ORDER — BUPIVACAINE HCL (PF) 0.25 % IJ SOLN
INTRAMUSCULAR | Status: DC | PRN
  Administered 2019-04-20: 7 mL

## 2019-04-20 MED ORDER — ONDANSETRON HCL 4 MG/2ML IJ SOLN
INTRAMUSCULAR | Status: AC
  Filled 2019-04-20: qty 2

## 2019-04-20 MED ORDER — CEFAZOLIN SODIUM-DEXTROSE 2-4 GM/100ML-% IV SOLN
2.0000 g | INTRAVENOUS | Status: AC
  Administered 2019-04-20: 2 g via INTRAVENOUS

## 2019-04-20 MED ORDER — FENTANYL CITRATE (PF) 100 MCG/2ML IJ SOLN
25.0000 ug | INTRAMUSCULAR | Status: DC | PRN
  Administered 2019-04-20: 25 ug via INTRAVENOUS

## 2019-04-20 MED ORDER — MIDAZOLAM HCL 5 MG/5ML IJ SOLN
INTRAMUSCULAR | Status: DC | PRN
  Administered 2019-04-20: 2 mg via INTRAVENOUS

## 2019-04-20 SURGICAL SUPPLY — 36 items
APL PRP STRL LF DISP 70% ISPRP (MISCELLANEOUS) ×1
BLADE SURG 15 STRL LF DISP TIS (BLADE) ×2 IMPLANT
BLADE SURG 15 STRL SS (BLADE) ×4
BNDG CMPR 9X4 STRL LF SNTH (GAUZE/BANDAGES/DRESSINGS) ×1
BNDG ELASTIC 3X5.8 VLCR STR LF (GAUZE/BANDAGES/DRESSINGS) ×2 IMPLANT
BNDG ESMARK 4X9 LF (GAUZE/BANDAGES/DRESSINGS) ×1 IMPLANT
BNDG GAUZE ELAST 4 BULKY (GAUZE/BANDAGES/DRESSINGS) ×2 IMPLANT
CHLORAPREP W/TINT 26 (MISCELLANEOUS) ×2 IMPLANT
CORD BIPOLAR FORCEPS 12FT (ELECTRODE) ×2 IMPLANT
COVER BACK TABLE REUSABLE LG (DRAPES) ×2 IMPLANT
COVER MAYO STAND REUSABLE (DRAPES) ×2 IMPLANT
COVER WAND RF STERILE (DRAPES) IMPLANT
CUFF TOURN SGL QUICK 18X4 (TOURNIQUET CUFF) ×2 IMPLANT
DRAPE EXTREMITY T 121X128X90 (DISPOSABLE) ×2 IMPLANT
DRAPE SURG 17X23 STRL (DRAPES) ×2 IMPLANT
DRSG PAD ABDOMINAL 8X10 ST (GAUZE/BANDAGES/DRESSINGS) ×2 IMPLANT
GAUZE SPONGE 4X4 12PLY STRL (GAUZE/BANDAGES/DRESSINGS) ×2 IMPLANT
GAUZE XEROFORM 1X8 LF (GAUZE/BANDAGES/DRESSINGS) ×2 IMPLANT
GLOVE BIO SURGEON STRL SZ7.5 (GLOVE) ×2 IMPLANT
GLOVE BIOGEL PI IND STRL 8 (GLOVE) ×1 IMPLANT
GLOVE BIOGEL PI INDICATOR 8 (GLOVE) ×1
GOWN STRL REUS W/ TWL LRG LVL3 (GOWN DISPOSABLE) ×1 IMPLANT
GOWN STRL REUS W/TWL LRG LVL3 (GOWN DISPOSABLE) ×2
GOWN STRL REUS W/TWL XL LVL3 (GOWN DISPOSABLE) ×2 IMPLANT
NDL HYPO 25X1 1.5 SAFETY (NEEDLE) ×1 IMPLANT
NEEDLE HYPO 25X1 1.5 SAFETY (NEEDLE) ×2 IMPLANT
NS IRRIG 1000ML POUR BTL (IV SOLUTION) ×2 IMPLANT
PACK BASIN DAY SURGERY FS (CUSTOM PROCEDURE TRAY) ×2 IMPLANT
PADDING CAST ABS 4INX4YD NS (CAST SUPPLIES) ×1
PADDING CAST ABS COTTON 4X4 ST (CAST SUPPLIES) ×1 IMPLANT
STOCKINETTE 4X48 STRL (DRAPES) ×2 IMPLANT
SUT ETHILON 4 0 PS 2 18 (SUTURE) ×2 IMPLANT
SYR BULB 3OZ (MISCELLANEOUS) ×2 IMPLANT
SYR CONTROL 10ML LL (SYRINGE) ×2 IMPLANT
TOWEL GREEN STERILE FF (TOWEL DISPOSABLE) ×4 IMPLANT
UNDERPAD 30X36 HEAVY ABSORB (UNDERPADS AND DIAPERS) ×2 IMPLANT

## 2019-04-20 NOTE — Discharge Instructions (Addendum)
[facility] Instructions °Hand Surgery ° °Wound Care: °Keep your hand elevated above the level of your heart.  Do not allow it to dangle by your side.  Keep the dressing dry and do not remove it unless your doctor advises you to do so.  He will usually change it at the time of your post-op visit.  Moving your fingers is advised to stimulate circulation but will depend on the site of your surgery.  If you have a splint applied, your doctor will advise you regarding movement. ° °Activity: °Do not drive or operate machinery today.  Rest today and then you may return to your normal activity and work as indicated by your physician. ° °Diet:  °Drink liquids today or eat a light diet.  You may resume a regular diet tomorrow.   ° °General expectations: °Pain for two to three days. °Fingers may become slightly swollen. ° °Call your doctor if any of the following occur: °Severe pain not relieved by pain medication. °Elevated temperature. °Dressing soaked with blood. °Inability to move fingers. °White or bluish color to fingers. ° ° °Post Anesthesia Home Care Instructions ° °Activity: °Get plenty of rest for the remainder of the day. A responsible individual must stay with you for 24 hours following the procedure.  °For the next 24 hours, DO NOT: °-Drive a car °-Operate machinery °-Drink alcoholic beverages °-Take any medication unless instructed by your physician °-Make any legal decisions or sign important papers. ° °Meals: °Start with liquid foods such as gelatin or soup. Progress to regular foods as tolerated. Avoid greasy, spicy, heavy foods. If nausea and/or vomiting occur, drink only clear liquids until the nausea and/or vomiting subsides. Call your physician if vomiting continues. ° °Special Instructions/Symptoms: °Your throat may feel dry or sore from the anesthesia or the breathing tube placed in your throat during surgery. If this causes discomfort, gargle with warm salt water. The discomfort should disappear within  24 hours. ° °If you had a scopolamine patch placed behind your ear for the management of post- operative nausea and/or vomiting: ° °1. The medication in the patch is effective for 72 hours, after which it should be removed.  Wrap patch in a tissue and discard in the trash. Wash hands thoroughly with soap and water. °2. You may remove the patch earlier than 72 hours if you experience unpleasant side effects which may include dry mouth, dizziness or visual disturbances. °3. Avoid touching the patch. Wash your hands with soap and water after contact with the patch. °  ° °Post Anesthesia Home Care Instructions ° °Activity: °Get plenty of rest for the remainder of the day. A responsible individual must stay with you for 24 hours following the procedure.  °For the next 24 hours, DO NOT: °-Drive a car °-Operate machinery °-Drink alcoholic beverages °-Take any medication unless instructed by your physician °-Make any legal decisions or sign important papers. ° °Meals: °Start with liquid foods such as gelatin or soup. Progress to regular foods as tolerated. Avoid greasy, spicy, heavy foods. If nausea and/or vomiting occur, drink only clear liquids until the nausea and/or vomiting subsides. Call your physician if vomiting continues. ° °Special Instructions/Symptoms: °Your throat may feel dry or sore from the anesthesia or the breathing tube placed in your throat during surgery. If this causes discomfort, gargle with warm salt water. The discomfort should disappear within 24 hours. ° °If you had a scopolamine patch placed behind your ear for the management of post- operative nausea and/or vomiting: ° °  1. The medication in the patch is effective for 72 hours, after which it should be removed.  Wrap patch in a tissue and discard in the trash. Wash hands thoroughly with soap and water. °2. You may remove the patch earlier than 72 hours if you experience unpleasant side effects which may include dry mouth, dizziness or visual  disturbances. °3. Avoid touching the patch. Wash your hands with soap and water after contact with the patch. °  ° °

## 2019-04-20 NOTE — Transfer of Care (Signed)
Immediate Anesthesia Transfer of Care Note  Patient: [facility]  Procedure(s) Performed: RIGHT CARPAL TUNNEL RELEASE (Right Wrist)  Patient Location: PACU  Anesthesia Type:General  Level of Consciousness: drowsy  Airway & Oxygen Therapy: Patient Spontanous Breathing and Patient connected to face mask oxygen  Post-op Assessment: Report given to RN and Post -op Vital signs reviewed and stable  Post vital signs: Reviewed and stable  Last Vitals:  Vitals Value Taken Time  BP 129/66 04/20/19 1448  Temp    Pulse 87 04/20/19 1451  Resp 18 04/20/19 1451  SpO2 100 % 04/20/19 1451  Vitals shown include unvalidated device data.  Last Pain:  Vitals:   04/20/19 1116  TempSrc: Skin  PainSc: 5       Patients Stated Pain Goal: 8 ([id number])  Complications: No apparent anesthesia complications

## 2019-04-20 NOTE — Anesthesia Procedure Notes (Signed)
Procedure Name: LMA Insertion Date/Time: 04/20/2019 2:20 PM Performed by: Genelle Bal, CRNA Pre-anesthesia Checklist: Patient identified, Emergency Drugs available, Suction available and Patient being monitored Patient Re-evaluated:Patient Re-evaluated prior to induction Oxygen Delivery Method: Circle system utilized Preoxygenation: Pre-oxygenation with 100% oxygen Induction Type: IV induction Ventilation: Mask ventilation without difficulty and Oral airway inserted - appropriate to patient size LMA: LMA inserted LMA Size: 4.0 Number of attempts: 1 Placement Confirmation: positive ETCO2 and breath sounds checked- equal and bilateral Tube secured with: Tape Dental Injury: Teeth and Oropharynx as per pre-operative assessment

## 2019-04-20 NOTE — H&P (Signed)
Kristy Stafford is an 43 y.o. female.   Chief Complaint: right carpal tunnel syndrome HPI: 43 yo female with numbness and tingling in right hand.  Nocturnal symptoms.  Positive nerve conduction studies.  She wishes to have a carpal tunnel release.  Allergies: No Known Allergies  Past Medical History:  Diagnosis Date  . Anemia   . Breast mass, left 10/10/2012   Excised 11/24/12, papilloma on path   . 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 Depression   . 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 High anti-Mullerian hormone 2020  . Hyperlipidemia   . Medical history non-contributory   . Nipple discharge    left breast  . PCOS (polycystic ovarian syndrome)   . Sleep apnea    has a cpap for 3 yr-told she will need to bring day of surgery and use post op    Past Surgical History:  Procedure Laterality Date  . BREAST DUCTAL SYSTEM EXCISION Left 11/24/2012   Procedure: EXCISION DUCTAL SYSTEM BREAST;  Surgeon: Haywood Lasso, MD;  Location: [facility];  Service: General;  Laterality: Left;  . BREAST EXCISIONAL BIOPSY Left 2014  . BREAST LUMPECTOMY WITH NEEDLE LOCALIZATION Right 03/13/2019   Procedure: RIGHT BREAST LUMPECTOMY WITH NEEDLE LOCALIZATION X2;  Surgeon: Fanny Skates, MD;  Location: [facility];  Service: General;  Laterality: Right;  . BREAST LUMPECTOMY WITH RADIOACTIVE SEED LOCALIZATION Right 03/13/2019   Procedure: RIGHT  BREAST LUMPECTOMY WITH RADIOACTIVE SEED LOCALIZATION;  Surgeon: Fanny Skates, MD;  Location: [facility];  Service: General;  Laterality: Right;  . DILATATION & CURETTAGE/HYSTEROSCOPY WITH MYOSURE N/A 03/03/2019   Procedure: DILATATION & CURETTAGE/HYSTEROSCOPY;  Surgeon: Nunzio Cobbs, MD;  Location: [facility];  Service: Gynecology;  Laterality: N/A;  . NO PAST SURGERIES      Family History: Family History  Problem Relation Age of Onset  . Cancer Mother 21        breast  . Breast cancer Mother   . Diabetes Father   . Cancer Maternal Grandmother        breast--dec age 73  . Breast cancer Maternal Grandmother   . Ovarian cancer Maternal Aunt        deceased  . Ovarian cancer Paternal Grandmother     Social History:   reports that she has quit smoking. She has never used smokeless tobacco. She reports previous alcohol use. She reports that she does not use drugs.  Medications: Medications Prior to Admission  Medication Sig Dispense Refill  . ibuprofen (ADVIL) 800 MG tablet Take 1 tablet (800 mg total) by mouth every 8 (eight) hours as needed. 30 tablet 0    No results found for this or any previous visit (from the past 48 hour(s)).  No results found.   A comprehensive review of systems was negative.  Blood pressure 119/76, pulse 87, temperature 97.8 F (36.6 C), temperature source Skin, resp. rate 20, height 5' 3.25" (1.607 m), weight 95.5 kg, last menstrual period 02/26/2019, SpO2 99 %.  General appearance: alert, cooperative and appears stated age Head: Normocephalic, without obvious abnormality, atraumatic Neck: supple, symmetrical, trachea midline Cardio: regular rate and rhythm Resp: clear to auscultation bilaterally Extremities: Intact sensation and capillary refill all digits.  +epl/fpl/io.  No wounds.  Pulses: 2+ and symmetric Skin: Skin color, texture, turgor normal. No rashes or lesions Neurologic: Grossly normal Incision/Wound: none  Assessment/Plan Right carpal tunnel syndrome.  Non operative and operative treatment  options have been discussed with the patient and patient wishes to proceed with operative treatment. Risks, benefits, and alternatives of surgery have been discussed and the patient agrees with the plan of care.   Leanora Cover 04/20/2019, 1:12 PM

## 2019-04-20 NOTE — Anesthesia Preprocedure Evaluation (Signed)
Anesthesia Evaluation  Patient identified by MRN, date of birth, ID band Patient awake    Reviewed: Allergy & Precautions, NPO status , Patient's Chart, lab work & pertinent test results  Airway Mallampati: II  TM Distance: >3 FB Neck ROM: Full    Dental no notable dental hx.    Pulmonary sleep apnea and Continuous Positive Airway Pressure Ventilation , former smoker,    Pulmonary exam normal breath sounds clear to auscultation       Cardiovascular negative cardio ROS Normal cardiovascular exam Rhythm:Regular Rate:Normal     Neuro/Psych negative neurological ROS  negative psych ROS   GI/Hepatic negative GI ROS, Neg liver ROS,   Endo/Other  PCOS  Renal/GU negative Renal ROS  negative genitourinary   Musculoskeletal negative musculoskeletal ROS (+)   Abdominal   Peds negative pediatric ROS (+)  Hematology negative hematology ROS (+)   Anesthesia Other Findings   Reproductive/Obstetrics negative OB ROS                             Anesthesia Physical Anesthesia Plan  ASA: III  Anesthesia Plan: Bier Block and Bier Block-LIDOCAINE ONLY   Post-op Pain Management:    Induction: Intravenous  PONV Risk Score and Plan: 2 and Propofol infusion  Airway Management Planned: Simple Face Mask  Additional Equipment:   Intra-op Plan:   Post-operative Plan:   Informed Consent: I have reviewed the patients History and Physical, chart, labs and discussed the procedure including the risks, benefits and alternatives for the proposed anesthesia with the patient or authorized representative who has indicated his/her understanding and acceptance.     Dental advisory given  Plan Discussed with: CRNA and Surgeon  Anesthesia Plan Comments:         Anesthesia Quick Evaluation

## 2019-04-20 NOTE — Anesthesia Postprocedure Evaluation (Signed)
Anesthesia Post Note  Patient: [employer]  Procedure(s) Performed: RIGHT CARPAL TUNNEL RELEASE (Right Wrist)     Patient location during evaluation: PACU Anesthesia Type: General Level of consciousness: awake and alert and oriented Pain management: pain level controlled Vital Signs Assessment: post-procedure vital signs reviewed and stable Respiratory status: spontaneous breathing, nonlabored ventilation and respiratory function stable Cardiovascular status: blood pressure returned to baseline and stable Postop Assessment: no apparent nausea or vomiting Anesthetic complications: no    Last Vitals:  Vitals:   04/20/19 1500 04/20/19 1515  BP: 135/74 (!) 142/82  Pulse: 78 77  Resp: 17 17  Temp:    SpO2: 100% 96%    Last Pain:  Vitals:   04/20/19 1515  TempSrc:   PainSc: 4                  Elayne Gruver A.

## 2019-04-20 NOTE — Op Note (Signed)
04/20/2019 [facility]                              OPERATIVE REPORT   PREOPERATIVE DIAGNOSIS:  Right carpal tunnel syndrome.  POSTOPERATIVE DIAGNOSIS:  Right carpal tunnel syndrome.  PROCEDURE:  Right carpal tunnel release.  SURGEON:  Leanora Cover, MD  ASSISTANT:  none.  ANESTHESIA: General  IV FLUIDS:  Per anesthesia flow sheet.  ESTIMATED BLOOD LOSS:  Minimal.  COMPLICATIONS:  None.  SPECIMENS:  None.  TOURNIQUET TIME:    Total Tourniquet Time Documented: Upper Arm (Right) - 30 minutes Total: Upper Arm (Right) - 30 minutes   DISPOSITION:  Stable to PACU.  LOCATION: [facility]  INDICATIONS:  43 yo female with numbness and tingling right hand.  Positive nerve conduction studies.  She wishes to have a carpal tunnel release for management of her symptoms.  Risks, benefits and alternatives of surgery were discussed including the risk of blood loss; infection; damage to nerves, vessels, tendons, ligaments, bone; failure of surgery; need for additional surgery; complications with wound healing; continued pain; recurrence of carpal tunnel syndrome; and damage to motor branch. She voiced understanding of these risks and elected to proceed.   OPERATIVE COURSE:  After being identified preoperatively by myself, the patient and I agreed upon the procedure and site of procedure.  The surgical site was marked.  The risks, benefits, and alternatives of the surgery were reviewed and she wished to proceed.  Surgical consent had been signed.  She was given IV Ancef as preoperative antibiotic prophylaxis.  She was transferred to the operating room and placed on the operating room table in supine position with the Right upper extremity on an armboard.  Bier block anesthesia was induced by the anesthesiologist.  This was later converted to general LMA anesthesia.  Right upper extremity was prepped and draped in normal sterile orthopaedic fashion.  A surgical pause was  performed between the surgeons, anesthesia, and operating room staff, and all were in agreement as to the patient, procedure, and site of procedure.  Tourniquet at the proximal aspect of the forearm had been inflated for the Bier block  The surgical field was injected with 0.25% plain lidocaine.  Anesthesia in the area was inadequate and conversion to general LMA was made.  Incision was made over the transverse carpal ligament and carried into the subcutaneous tissues by spreading technique.  Bipolar electrocautery was used to obtain hemostasis.  The palmar fascia was sharply incised.  The transverse carpal ligament was identified and sharply incised.  It was incised distally first.  Care was taken to ensure complete decompression distally.  It was then incised proximally.  Scissors were used to split the distal aspect of the volar antebrachial fascia.  A finger was placed into the wound to ensure complete decompression, which was the case.  The nerve was examined.  It was adherent to the radial leaflet.  The motor branch was identified and was intact.  The wound was copiously irrigated with sterile saline.  It was then closed with 4-0 nylon in a horizontal mattress fashion.  It was injected with 0.25% plain Marcaine to aid in postoperative analgesia.  It was dressed with sterile Xeroform, 4x4s, an ABD, and wrapped with Kerlix and an Ace bandage.  Tourniquet was deflated at 30 minutes.  Fingertips were pink with brisk capillary refill after deflation of the tourniquet.  Operative drapes were broken down.  The patient was awoken from anesthesia safely.  She was transferred back to stretcher and taken to the PACU in stable condition.  I will see her back in the office in 1 week for postoperative followup.  I will give her a prescription for Norco 5/325 1-2 tabs PO q6 hours prn pain, dispense # 20.    Leanora Cover, MD Electronically signed, 04/20/19

## 2019-04-21 MED ORDER — LIDOCAINE HCL (PF) 0.5 % IJ SOLN
INTRAMUSCULAR | Status: DC | PRN
  Administered 2019-04-20: 35 mL via INTRAVENOUS

## 2019-04-21 NOTE — Addendum Note (Signed)
Addendum  created 04/21/19 0716 by Gwyndolyn Saxon, CRNA   Intraprocedure Meds edited

## 2019-04-22 ENCOUNTER — Encounter (HOSPITAL_BASED_OUTPATIENT_CLINIC_OR_DEPARTMENT_OTHER): Payer: Self-pay | Admitting: Orthopedic Surgery

## 2019-04-28 NOTE — Progress Notes (Signed)
Kristy Stafford CONSULT NOTE  Patient Care Team: Lucianne Lei, MD as PCP - General (Family Medicine)  CHIEF COMPLAINTS/PURPOSE OF CONSULTATION:  Newly diagnosed breast cancer  HISTORY OF PRESENTING ILLNESS:  [facility] 43 y.o. female is here because of recent diagnosis of high risk for breast cancer given a history of multifocal left breast papillomas. She reported left breast pain and mammogram on 12/17/18 showed two right breast masses and a prominent right axillary lymph node. Biopsy on 12/24/18 showed intraductal papilloma with no evidence of malignancy. She underwent a lumpectomy x3 with Dr. Dalbert Batman on 03/13/19 for which pathology showed benign papillomas and no atypia. She has a family history of breast cancer in her maternal grandmother at 91yo and in her mother who died from metastatic breast cancer at 41yo. She presents to the clinic today for initial evaluation and discussion of surveillance options.   I reviewed her records extensively and collaborated the history with the patient.  SUMMARY OF ONCOLOGIC HISTORY: Oncology History   No history exists.    MEDICAL HISTORY:  Past Medical History:  Diagnosis Date  . Anemia   . Breast mass, left 10/10/2012   Excised 11/24/12, papilloma on path   . 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 Depression   . 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 High anti-Mullerian hormone 2020  . Hyperlipidemia   . Medical history non-contributory   . Nipple discharge    left breast  . PCOS (polycystic ovarian syndrome)   . Sleep apnea    has a cpap for 3 yr-told she will need to bring day of surgery and use post op    SURGICAL HISTORY: Past Surgical History:  Procedure Laterality Date  . BREAST DUCTAL SYSTEM EXCISION Left 11/24/2012   Procedure: EXCISION DUCTAL SYSTEM BREAST;  Surgeon: Haywood Lasso, MD;  Location: [place];  Service: General;  Laterality: Left;  . BREAST EXCISIONAL  BIOPSY Left 2014  . BREAST LUMPECTOMY WITH NEEDLE LOCALIZATION Right 03/13/2019   Procedure: RIGHT BREAST LUMPECTOMY WITH NEEDLE LOCALIZATION X2;  Surgeon: Fanny Skates, MD;  Location: [place];  Service: General;  Laterality: Right;  . BREAST LUMPECTOMY WITH RADIOACTIVE SEED LOCALIZATION Right 03/13/2019   Procedure: RIGHT  BREAST LUMPECTOMY WITH RADIOACTIVE SEED LOCALIZATION;  Surgeon: Fanny Skates, MD;  Location: [place];  Service: General;  Laterality: Right;  . CARPAL TUNNEL RELEASE Right 04/20/2019   Procedure: RIGHT CARPAL TUNNEL RELEASE;  Surgeon: Leanora Cover, MD;  Location: [place];  Service: Orthopedics;  Laterality: Right;  . DILATATION & CURETTAGE/HYSTEROSCOPY WITH MYOSURE N/A 03/03/2019   Procedure: DILATATION & CURETTAGE/HYSTEROSCOPY;  Surgeon: Nunzio Cobbs, MD;  Location: [facility];  Service: Gynecology;  Laterality: N/A;  . NO PAST SURGERIES      SOCIAL HISTORY: Social History   Socioeconomic History  . Marital status: Married    Spouse name: Not on file  . Number of children: Not on file  . Years of education: Not on file  . Highest education level: Not on file  Occupational History  . Not on file  Social Needs  . Financial resource strain: Not on file  . Food insecurity    Worry: Not on file    Inability: Not on file  . Transportation needs    Medical: Not on file    Non-medical: Not on file  Tobacco Use  . Smoking status: Former Research scientist (life sciences)  . Smokeless tobacco:  Never Used  . Tobacco comment: per patient smokes hemp smoked 04/18/19 per pt  Substance and Sexual Activity  . Alcohol use: Not Currently  . Drug use: No  . Sexual activity: Not Currently    Partners: Male    Birth control/protection: None, Abstinence  Lifestyle  . Physical activity    Days per week: Not on file    Minutes per session: Not on file  . Stress: Not on file  Relationships  . Social Product manager on phone: Not on file    Gets together: Not on file    Attends religious service: Not on file    Active member of club or organization: Not on file    Attends meetings of clubs or organizations: Not on file    Relationship status: Not on file  . Intimate partner violence    Fear of current or ex partner: Not on file    Emotionally abused: Not on file    Physically abused: Not on file    Forced sexual activity: Not on file  Other Topics Concern  . Not on file  Social History Narrative   Single, no children   Right handed   Some college   < 1 cup daily    FAMILY HISTORY: Family History  Problem Relation Age of Onset  . Cancer Mother 27       breast  . Breast cancer Mother   . Diabetes Father   . Cancer Maternal Grandmother        breast--dec age 81  . Breast cancer Maternal Grandmother   . Ovarian cancer Maternal Aunt        deceased  . Ovarian cancer Paternal Grandmother     ALLERGIES:  has No Known Allergies.  MEDICATIONS:  Current Outpatient Medications  Medication Sig Dispense Refill  . HYDROcodone-acetaminophen (NORCO) 5-325 MG tablet 1-2 tabs po q6 hours prn pain 20 tablet 0  . ibuprofen (ADVIL) 800 MG tablet Take 1 tablet (800 mg total) by mouth every 8 (eight) hours as needed. 30 tablet 0   No current facility-administered medications for this visit.     REVIEW OF SYSTEMS:   Constitutional: Denies fevers, chills or abnormal night sweats Eyes: Denies blurriness of vision, double vision or watery eyes Ears, nose, mouth, throat, and face: Denies mucositis or sore throat Respiratory: Denies cough, dyspnea or wheezes Cardiovascular: Denies palpitation, chest discomfort or lower extremity swelling Gastrointestinal:  Denies nausea, heartburn or change in bowel habits Skin: Denies abnormal skin rashes Lymphatics: Denies new lymphadenopathy or easy bruising Neurological:Denies numbness, tingling or new weaknesses Behavioral/Psych: Mood is stable, no new  changes  Breast: s/p left lumpectomy  All other systems were reviewed with the patient and are negative.  PHYSICAL EXAMINATION: ECOG PERFORMANCE STATUS: 1 - Symptomatic but completely ambulatory  Vitals:   04/29/19 1310  BP: 121/90  Pulse: 91  Resp: 18  Temp: 98 F (36.7 C)  SpO2: 98%   Filed Weights   04/29/19 1310  Weight: 208 lb 3.2 oz (94.4 kg)    GENERAL:alert, no distress and comfortable SKIN: skin color, texture, turgor are normal, no rashes or significant lesions EYES: normal, conjunctiva are pink and non-injected, sclera clear OROPHARYNX:no exudate, no erythema and lips, buccal mucosa, and tongue normal  NECK: supple, thyroid normal size, non-tender, without nodularity LYMPH:  no palpable lymphadenopathy in the cervical, axillary or inguinal LUNGS: clear to auscultation and percussion with normal breathing effort HEART: regular rate & rhythm and  no murmurs and no lower extremity edema ABDOMEN:abdomen soft, non-tender and normal bowel sounds Musculoskeletal:no cyanosis of digits and no clubbing  PSYCH: alert & oriented x 3 with fluent speech NEURO: no focal motor/sensory deficits BREAST: No palpable nodules in breast. No palpable axillary or supraclavicular lymphadenopathy (exam performed in the presence of a chaperone)   LABORATORY DATA:  I have reviewed the data as listed Lab Results  Component Value Date   WBC 8.8 03/03/2019   HGB 12.9 03/03/2019   HCT 42.1 03/03/2019   MCV 83.5 03/03/2019   PLT 298 03/03/2019   Lab Results  Component Value Date   NA 138 03/03/2019   K 3.5 03/03/2019   CL 104 03/03/2019   CO2 18 (L) 03/03/2019    RADIOGRAPHIC STUDIES: I have personally reviewed the radiological reports and agreed with the findings in the report.  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 They're not concerning for malignancy but they can occasionally [place] either atypical ductal hyperplasia or DCIS. Because of this many surgeons recommend removal of these lesions. Retrospective studies have revealed that 15% of them could be upgraded to malignancy following surgical resection.  Risk reduction: Tamoxifen 10 mg p.o. daily x5 years And started the patient on a lower dosage because of the recent clinical studies showing reduced dose of tamoxifen to be equally effective for patients with high risk of breast cancer.  She also has a history of uterine hypertrophy and therefore I am cautious about the dosing.  Patient's mother and grandmother were both diagnosed with breast cancer.  We will refer her to genetics.  Breast cancer surveillance with breast exams and annual mammograms is recommended. Return to clinic in 3 months for toxicity evaluation.     All questions were answered. The patient knows to call the clinic with any problems, questions or concerns.   Rulon Eisenmenger, MD, MPH 04/29/2019    I, Molly Dorshimer, am acting as scribe for Nicholas Lose, MD.  I have reviewed the above documentation for accuracy and completeness, and I agree with the above.

## 2019-04-29 ENCOUNTER — Other Ambulatory Visit: Payer: Self-pay

## 2019-04-29 ENCOUNTER — Inpatient Hospital Stay: Payer: Managed Care, Other (non HMO)

## 2019-04-29 ENCOUNTER — Telehealth: Payer: Self-pay | Admitting: Hematology and Oncology

## 2019-04-29 ENCOUNTER — Inpatient Hospital Stay: Payer: Managed Care, Other (non HMO) | Attending: Hematology and Oncology | Admitting: Hematology and Oncology

## 2019-04-29 DIAGNOSIS — Z87891 Personal history of nicotine dependence: Secondary | ICD-10-CM | POA: Diagnosis not present

## 2019-04-29 DIAGNOSIS — D241 Benign neoplasm of right breast: Secondary | ICD-10-CM | POA: Insufficient documentation

## 2019-04-29 DIAGNOSIS — Z8049 Family history of malignant neoplasm of other genital organs: Secondary | ICD-10-CM

## 2019-04-29 DIAGNOSIS — Z803 Family history of malignant neoplasm of breast: Secondary | ICD-10-CM

## 2019-04-29 DIAGNOSIS — N6011 Diffuse cystic mastopathy of right breast: Secondary | ICD-10-CM

## 2019-04-29 MED ORDER — TAMOXIFEN CITRATE 10 MG PO TABS: 10.0000 mg | ORAL_TABLET | Freq: Every day | ORAL | 3 refills | Status: DC

## 2019-04-29 NOTE — Telephone Encounter (Signed)
I talk with patient regarding schedule  

## 2019-04-29 NOTE — Assessment & Plan Note (Signed)
03/13/2019: Multiple lumpectomies: 1:00: Intraductal papilloma, [facility] 3:00: Retroareolar: Intraductal papilloma, U[facility] 7:00: Intraductal papilloma, U[facility] Additional areolar margin:UDH  Pathology counseling: These are characterized by papillary cells that going to the lumen from the wall of the cyst. They're not concerning for malignancy but they can occasionally [place] either atypical ductal hyperplasia or DCIS. Because of this many surgeons recommend removal of these lesions. Retrospective studies have revealed that 15% of them could be upgraded to malignancy following surgical resection.  Risk reduction: There is no role of antiestrogen therapy. Breast cancer surveillance with breast exams and annual mammograms is recommended. Patient will follow with [place] annually.

## 2019-04-30 ENCOUNTER — Telehealth: Payer: Self-pay | Admitting: *Deleted

## 2019-04-30 ENCOUNTER — Encounter: Payer: Self-pay | Admitting: Licensed Clinical Social Worker

## 2019-04-30 ENCOUNTER — Telehealth: Payer: 59 | Admitting: Licensed Clinical Social Worker

## 2019-04-30 DIAGNOSIS — Z8042 Family history of malignant neoplasm of prostate: Secondary | ICD-10-CM

## 2019-04-30 DIAGNOSIS — Z800 Family history of malignant neoplasm of digestive organs: Secondary | ICD-10-CM

## 2019-04-30 DIAGNOSIS — Z8041 Family history of malignant neoplasm of ovary: Secondary | ICD-10-CM

## 2019-04-30 DIAGNOSIS — Z803 Family history of malignant neoplasm of breast: Secondary | ICD-10-CM | POA: Insufficient documentation

## 2019-04-30 DIAGNOSIS — N6011 Diffuse cystic mastopathy of right breast: Secondary | ICD-10-CM

## 2019-04-30 NOTE — Progress Notes (Signed)
REFERRING PROVIDER: Nicholas Lose, MD [place],  [place] [id number]  PRIMARY PROVIDER:  Lucianne Lei, MD  PRIMARY REASON FOR VISIT:  1. Ductal papillomatosis of right breast   2. Family history of breast cancer   3. Family history of ovarian cancer   4. Family history of pancreatic cancer   5. Family history of prostate cancer    I connected with Kristy Stafford on 12/3/220 at 2:00 PM EDT by Webex and verified that I am speaking with the correct person using two identifiers.    Patient location: home Provider location: clinic   HISTORY OF PRESENT ILLNESS:   Kristy Stafford, a 43 y.o. female, was seen for a [place] cancer genetics consultation at the request of Dr. Lindi Adie due to a family history of cancer.  Kristy Stafford presents to clinic today to discuss the possibility of a hereditary predisposition to cancer, genetic testing, and to further clarify her future cancer risks, as well as potential cancer risks for family members.   Kristy Stafford is a 43 y.o. female with no personal history of cancer. She has had a history of multifocal left breast papillomas. Recently she had 2 right breast masses, biopsy showed intraductal papilloma and she had lumpectomy x3 on 03/13/2019.   CANCER HISTORY:  Oncology History   No history exists.     RISK FACTORS:  Menarche was at age 45.  First live birth at age no children.  OCP use for approximately "couple of years" years.  Ovaries intact: yes.  Hysterectomy: no.  Menopausal status: premenopausal.  HRT use: 0 years. Colonoscopy: no; not examined. Mammogram within the last year: yes. Number of breast biopsies: 4. Up to date with pelvic exams: yes. Any excessive radiation exposure in the past: no  Past Medical History:  Diagnosis Date  . Anemia   . Breast mass, left 10/10/2012   Excised 11/24/12, papilloma on path   . 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 Depression   . Ductal papillomatosis of right  breast 03/13/2019  . Family history of breast cancer   . Family history of ovarian cancer   . Family history of pancreatic cancer   . Family history of prostate cancer   . High anti-Mullerian hormone 2020  . Hyperlipidemia   . Medical history non-contributory   . Nipple discharge    left breast  . PCOS (polycystic ovarian syndrome)   . Sleep apnea    has a cpap for 3 yr-told she will need to bring day of surgery and use post op    Past Surgical History:  Procedure Laterality Date  . BREAST DUCTAL SYSTEM EXCISION Left 11/24/2012   Procedure: EXCISION DUCTAL SYSTEM BREAST;  Surgeon: Haywood Lasso, MD;  Location: [place];  Service: General;  Laterality: Left;  . BREAST EXCISIONAL BIOPSY Left 2014  . BREAST LUMPECTOMY WITH NEEDLE LOCALIZATION Right 03/13/2019   Procedure: RIGHT BREAST LUMPECTOMY WITH NEEDLE LOCALIZATION X2;  Surgeon: Fanny Skates, MD;  Location: [place];  Service: General;  Laterality: Right;  . BREAST LUMPECTOMY WITH RADIOACTIVE SEED LOCALIZATION Right 03/13/2019   Procedure: RIGHT  BREAST LUMPECTOMY WITH RADIOACTIVE SEED LOCALIZATION;  Surgeon: Fanny Skates, MD;  Location: [place];  Service: General;  Laterality: Right;  . CARPAL TUNNEL RELEASE Right 04/20/2019   Procedure: RIGHT CARPAL TUNNEL RELEASE;  Surgeon: Leanora Cover, MD;  Location: [place];  Service: Orthopedics;  Laterality: Right;  . DILATATION & CURETTAGE/HYSTEROSCOPY WITH  MYOSURE N/A 03/03/2019   Procedure: DILATATION & CURETTAGE/HYSTEROSCOPY;  Surgeon: Nunzio Cobbs, MD;  Location: [facility];  Service: Gynecology;  Laterality: N/A;  . NO PAST SURGERIES      Social History   Socioeconomic History  . Marital status: Married    Spouse name: Not on file  . Number of children: Not on file  . Years of education: Not on file  . Highest education level: Not on file  Occupational History  . Not on  file  Social Needs  . Financial resource strain: Not on file  . Food insecurity    Worry: Not on file    Inability: Not on file  . Transportation needs    Medical: Not on file    Non-medical: Not on file  Tobacco Use  . Smoking status: Former Research scientist (life sciences)  . Smokeless tobacco: Never Used  . Tobacco comment: per patient smokes hemp smoked 04/18/19 per pt  Substance and Sexual Activity  . Alcohol use: Not Currently  . Drug use: No  . Sexual activity: Not Currently    Partners: Male    Birth control/protection: None, Abstinence  Lifestyle  . Physical activity    Days per week: Not on file    Minutes per session: Not on file  . Stress: Not on file  Relationships  . Social Herbalist on phone: Not on file    Gets together: Not on file    Attends religious service: Not on file    Active member of club or organization: Not on file    Attends meetings of clubs or organizations: Not on file    Relationship status: Not on file  Other Topics Concern  . Not on file  Social History Narrative   Single, no children   Right handed   Some college   < 1 cup daily     FAMILY HISTORY:  We obtained a detailed, 4-generation family history.  Significant diagnoses are listed below: Family History  Problem Relation Age of Onset  . Cancer Mother 69       breast  . Breast cancer Mother   . Diabetes Father   . Cancer Maternal Grandmother        breast--dec age 78  . Breast cancer Maternal Grandmother   . Cancer Maternal Aunt        unsure type, possibly pancreatic  . Ovarian cancer Paternal Grandmother 42  . Prostate cancer Paternal Uncle 77   Kristy Stafford does not have children. She has 3 full sisters, 2 are twins. She has one paternal half brother. None of her siblings have had cancer.  Kristy Stafford mother was diagnosed with breast cancer at 6 and died at 20, this was metastatic cancer. Kristy Stafford had 6 maternal aunts and 1 maternal uncle. One of her aunts had cancer, possibly  pancreatic, and passed away. No known cancers in maternal cousins. Maternal grandmother had breast cancer and died at 14. Her grandmother's sister had pancreatic cancer in her 32s. Maternal grandfather died young, she has limited information about him.   Kristy Stafford father is living at 66. Patient has 1 paternal uncle, he had prostate cancer at 79. She does not have paternal first cousins. Paternal grandmother had ovarian cancer diagnosed at 1 and died at 54. She does not have information about paternal grandfather.  Kristy Stafford is unaware of previous family history of genetic testing for hereditary cancer risks. Patient's maternal ancestors are of  Black/African American descent, and paternal ancestors are of 73 American descent. There is no reported Ashkenazi Jewish ancestry. There is no known consanguinity.  GENETIC COUNSELING ASSESSMENT: Kristy Stafford is a 43 y.o. female with a family history which is somewhat suggestive of a hereditary cancer syndrome and predisposition to cancer. We, therefore, discussed and recommended the following at today's visit.   DISCUSSION: We discussed that 5 - 10% of breast cancer is hereditary, with most cases associated with BRCA1/BRCA2 mutations.  There are other genes that can be associated with hereditary breast cancer syndromes. Some of these genes also increase the risk for other cancers seen in her family.  We discussed that testing is beneficial for several reasons including knowing how to follow individuals for cancer screenings, and understand if other family members could be at risk for cancer and allow them to undergo genetic testing.   We reviewed the characteristics, features and inheritance patterns of hereditary cancer syndromes. We also discussed genetic testing, including the appropriate family members to test, the process of testing, insurance coverage and turn-around-time for results. We discussed the implications of a negative, positive and/or  variant of uncertain significant result. We recommended Kristy Stafford pursue genetic testing for the [employer] gene panel.   Based on Ms. Kats's family history of cancer, she meets medical criteria for genetic testing. Despite that she meets criteria, she may still have an out of pocket cost.   PLAN: After considering the risks, benefits, and limitations, Kristy Stafford provided informed consent to pursue genetic testing and the blood sample was sent to [facility] for analysis of the Multi-Cancer Panel. Results should be available within approximately 2-3 weeks' time, at which point they will be disclosed by telephone to Kristy Stafford, as will any additional recommendations warranted by these results. Kristy Stafford will receive a summary of her genetic counseling visit and a copy of her results once available. This information will also be available in Epic.   Kristy Stafford questions were answered to her satisfaction today. Our contact information was provided should additional questions or concerns arise. Thank you for the referral and allowing [place] to share in the care of your patient.   Faith Rogue, MS, [facility] Genetic Counselor [email or web address] Phone: [phone]  The patient was seen for a total of 30 minutes in face-to-face genetic counseling.  Drs. Magrinat, Lindi Adie and/or Burr Medico were available for discussion regarding this case.   _______________________________________________________________________ For Office Staff:  Number of people involved in session: 1 Was an Intern/ student involved with case: no

## 2019-04-30 NOTE — Telephone Encounter (Signed)
Dr. Quincy Simmonds -patient is in [facility] for surgical consult with Dr. Dalbert Batman. Right breast lumpectomy on 03/13/19, ok to remove from  [facility] hold?

## 2019-05-01 NOTE — Telephone Encounter (Signed)
Patient removed from [facility] hold.   Encounter closed.  

## 2019-05-01 NOTE — Telephone Encounter (Signed)
Call placed to convey benefits for Mirena.

## 2019-05-01 NOTE — Telephone Encounter (Signed)
Ok to remove from mammogram hold.  

## 2019-05-04 ENCOUNTER — Telehealth: Payer: Self-pay | Admitting: Obstetrics and Gynecology

## 2019-05-04 ENCOUNTER — Other Ambulatory Visit: Payer: Self-pay | Admitting: Orthopedic Surgery

## 2019-05-04 DIAGNOSIS — Z30014 Encounter for initial prescription of intrauterine contraceptive device: Secondary | ICD-10-CM

## 2019-05-04 NOTE — Telephone Encounter (Signed)
Patient want to speak with nurse about rescheduling mirena insertion. She is scheduled on 12/15 and would like sooner.

## 2019-05-04 NOTE — Telephone Encounter (Signed)
Spoke with pt. Pt rescheduled IUD from 12/15 to 12/9 d/t carpel tunnel surgery on 12/18. Pt has remained abstinent and will remain until appt on 05/06/19 at 8am with Dr Quincy Simmonds. Pt verbalized understanding. Pt instructed to take Motrin 800 mg with food and water one hour before procedure. Pt agreeable.   Will route to Dr Quincy Simmonds for final review and will close encounter.

## 2019-05-05 ENCOUNTER — Telehealth: Payer: Self-pay | Admitting: Obstetrics and Gynecology

## 2019-05-05 MED ORDER — MISOPROSTOL 200 MCG PO TABS: ORAL_TABLET | ORAL | 0 refills | Status: DC

## 2019-05-05 NOTE — Progress Notes (Signed)
GYNECOLOGY  VISIT   HPI: 43 y.o.   Married  [place] American  female   [id number] with No LMP recorded. (Menstrual status: Other).   here for Mirena IUD insertion. Had spotting this Am. Last bleeding was in 02/2019.  Had genetic conditions and is waiting for results.  Just started Tamoxifen.   UPT:  Negative.  GYNECOLOGIC HISTORY: No LMP recorded. (Menstrual status: Other). Contraception:  Abstinence Menopausal hormone therapy:  n/a Last mammogram:  12-17-18---See Epic Last pap smear:  12/11/18 Neg:Neg HR HPV        OB History    Gravida  3   Para      Term      Preterm      AB  3   Living        SAB  3   TAB      Ectopic      Multiple      Live Births                 Patient Active Problem List   Diagnosis Date Noted  . Family history of breast cancer   . Family history of ovarian cancer   . Family history of pancreatic cancer   . Family history of prostate cancer   . 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 Anemia   . Breast mass, left 10/10/2012   Excised 11/24/12, papilloma on path   . 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 Depression   . 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 Family history of breast cancer   . Family history of ovarian cancer   . Family history of pancreatic cancer   . Family history of prostate cancer   . High anti-Mullerian hormone 2020  . Hyperlipidemia   . Medical history non-contributory   . Nipple discharge    left breast  . PCOS (polycystic ovarian syndrome)   . Sleep apnea    has a cpap for 3 yr-told she will need to bring day of surgery and use post op    Past Surgical History:  Procedure Laterality Date  . BREAST DUCTAL SYSTEM EXCISION Left 11/24/2012   Procedure: EXCISION DUCTAL SYSTEM BREAST;  Surgeon: Haywood Lasso, MD;  Location: [place];  Service: General;   Laterality: Left;  . BREAST EXCISIONAL BIOPSY Left 2014  . BREAST LUMPECTOMY WITH NEEDLE LOCALIZATION Right 03/13/2019   Procedure: RIGHT BREAST LUMPECTOMY WITH NEEDLE LOCALIZATION X2;  Surgeon: Fanny Skates, MD;  Location: [place];  Service: General;  Laterality: Right;  . BREAST LUMPECTOMY WITH RADIOACTIVE SEED LOCALIZATION Right 03/13/2019   Procedure: RIGHT  BREAST LUMPECTOMY WITH RADIOACTIVE SEED LOCALIZATION;  Surgeon: Fanny Skates, MD;  Location: [place];  Service: General;  Laterality: Right;  . CARPAL TUNNEL RELEASE Right 04/20/2019   Procedure: RIGHT CARPAL TUNNEL RELEASE;  Surgeon: Leanora Cover, MD;  Location: [place];  Service: Orthopedics;  Laterality: Right;  . DILATATION & CURETTAGE/HYSTEROSCOPY WITH MYOSURE N/A 03/03/2019   Procedure: DILATATION & CURETTAGE/HYSTEROSCOPY;  Surgeon: Nunzio Cobbs, MD;  Location: [facility];  Service: Gynecology;  Laterality: N/A;  . NO PAST SURGERIES      Current Outpatient Medications  Medication Sig Dispense Refill  . ibuprofen (ADVIL) 800 MG tablet Take 1 tablet (800 mg total) by mouth every 8 (eight) hours as needed.  30 tablet 0  . misoprostol (CYTOTEC) 200 MCG tablet Insert 1 tablet vaginally the night before procedure. 1 tablet 0  . tamoxifen (NOLVADEX) 10 MG tablet Take 1 tablet (10 mg total) by mouth daily. 90 tablet 3   No current facility-administered medications for this visit.      ALLERGIES: Patient has no known allergies.  Family History  Problem Relation Age of Onset  . Cancer Mother 38       breast  . Breast cancer Mother   . Diabetes Father   . Cancer Maternal Grandmother        breast--dec age 49  . Breast cancer Maternal Grandmother   . Cancer Maternal Aunt        unsure type, possibly pancreatic  . Ovarian cancer Paternal Grandmother 22  . Prostate cancer Paternal Uncle 71    Social History   Socioeconomic History  .  Marital status: Married    Spouse name: Not on file  . Number of children: Not on file  . Years of education: Not on file  . Highest education level: Not on file  Occupational History  . Not on file  Social Needs  . Financial resource strain: Not on file  . Food insecurity    Worry: Not on file    Inability: Not on file  . Transportation needs    Medical: Not on file    Non-medical: Not on file  Tobacco Use  . Smoking status: Former Research scientist (life sciences)  . Smokeless tobacco: Never Used  . Tobacco comment: per patient smokes hemp smoked 04/18/19 per pt  Substance and Sexual Activity  . Alcohol use: Not Currently  . Drug use: No  . Sexual activity: Not Currently    Partners: Male    Birth control/protection: None, Abstinence  Lifestyle  . Physical activity    Days per week: Not on file    Minutes per session: Not on file  . Stress: Not on file  Relationships  . Social Herbalist on phone: Not on file    Gets together: Not on file    Attends religious service: Not on file    Active member of club or organization: Not on file    Attends meetings of clubs or organizations: Not on file    Relationship status: Not on file  . Intimate partner violence    Fear of current or ex partner: Not on file    Emotionally abused: Not on file    Physically abused: Not on file    Forced sexual activity: Not on file  Other Topics Concern  . Not on file  Social History Narrative   Single, no children   Right handed   Some college   < 1 cup daily    Review of Systems  All other systems reviewed and are negative.   PHYSICAL EXAMINATION:    BP 124/68 (Cuff Size: Large)   Pulse 80   Temp (!) 96.6 F (35.9 C) (Temporal)   Ht 5' 3.25" (1.607 m)   Wt 212 lb (96.2 kg)   BMI 37.26 kg/m     General appearance: alert, cooperative and appears stated age   Pelvic: External genitalia:  no lesions              Urethra:  normal appearing urethra with no masses, tenderness or lesions               Bartholins and Skenes: normal  Vagina: normal appearing vagina with normal color and discharge, no lesions              Cervix: no lesions                Bimanual Exam:  Uterus:  normal size, contour, position, consistency, mobility, non-tender              Adnexa: no mass, fullness, tenderness            Consent for Mirena IUD insertion.  Lot [id number], expiration Jan 2023. Speculum placed in vagina.  Sterile prep of cervix with Hibiclens.  Paracervical block with 10 cc 1% lidocaine - lot [id number]      , expiration April 2022. Tenaculum to anterior cervical lip.  Uterus sounded to just over 7 cm.  Mirena  IUD placed without difficulty.  Strings trimmed and shown to patient.  Repeat bimanual exam, no change. No complications.  Minimal EBL.  ASSESSMENT  IUD insertion.   PLAN  Instructions and precautions given.  Back up contraception discussed. Card given to patient with insertion date, recommended removal date, and lot number.  Follow up for a recheck in 4 weeks, sooner as needed.  After visit summary to patient.   Chaperone was present for exam.

## 2019-05-05 NOTE — Addendum Note (Signed)
Addended by: Georgia Lopes on: 05/05/2019 11:59 AM   Modules accepted: Orders

## 2019-05-05 NOTE — Telephone Encounter (Signed)
Spoke to pt. Pt made aware and instructed on how to use Cytotec. Sent order for #1 tablet with 0RF for IUD insertion to pharmacy on file. Pt agreeable and verbalized understanding.  Will route to Dr Quincy Simmonds for review and will close encounter.

## 2019-05-05 NOTE — Telephone Encounter (Signed)
Call placed to convey benefits for Mirena insertion. Spoke with the patient and conveyed benefits. Patient understands/agreeable with the benefits. Patient is aware of cancellation policy. Appointment scheduled 05/06/19.

## 2019-05-05 NOTE — Telephone Encounter (Signed)
I recommend she use Cytotec 200 mcg per vagina tonight before bed.  This will help to soften and dilate her cervix for her IUD insertion.  Please send to her pharmacy of choice.  She needs just one tablet.

## 2019-05-06 ENCOUNTER — Ambulatory Visit (INDEPENDENT_AMBULATORY_CARE_PROVIDER_SITE_OTHER): Payer: 59 | Admitting: Obstetrics and Gynecology

## 2019-05-06 ENCOUNTER — Other Ambulatory Visit: Payer: Self-pay

## 2019-05-06 ENCOUNTER — Telehealth: Payer: Self-pay | Admitting: Obstetrics and Gynecology

## 2019-05-06 ENCOUNTER — Encounter: Payer: Self-pay | Admitting: Obstetrics and Gynecology

## 2019-05-06 VITALS — BP 124/68 | HR 80 | Temp 96.6°F | Ht 63.25 in | Wt 212.0 lb

## 2019-05-06 DIAGNOSIS — Z30014 Encounter for initial prescription of intrauterine contraceptive device: Secondary | ICD-10-CM

## 2019-05-06 DIAGNOSIS — Z30430 Encounter for insertion of intrauterine contraceptive device: Secondary | ICD-10-CM | POA: Diagnosis not present

## 2019-05-06 DIAGNOSIS — Z8742 Personal history of other diseases of the female genital tract: Secondary | ICD-10-CM

## 2019-05-06 DIAGNOSIS — Z01812 Encounter for preprocedural laboratory examination: Secondary | ICD-10-CM

## 2019-05-06 LAB — POCT URINE PREGNANCY: Preg Test, Ur: NEGATIVE

## 2019-05-06 NOTE — Telephone Encounter (Signed)
Spoke with pt. Pt scheduled for IUD check on 06/05/2019 at 10am d/t work schedule. Pt agreeable. IUD insertion on 05/06/19.   Will close encounter.

## 2019-05-06 NOTE — Patient Instructions (Signed)
Intrauterine Device Insertion, Care After  This sheet gives you information about how to care for yourself after your procedure. Your health care provider may also give you more specific instructions. If you have problems or questions, contact your health care provider. What can I expect after the procedure? After the procedure, it is common to have:  Cramps and pain in the abdomen.  Light bleeding (spotting) or heavier bleeding that is like your menstrual period. This may last for up to a few days.  Lower back pain.  Dizziness.  Headaches.  Nausea. Follow these instructions at home:  Before resuming sexual activity, check to make sure that you can feel the IUD string(s). You should be able to feel the end of the string(s) below the opening of your cervix. If your IUD string is in place, you may resume sexual activity. ? If you had a hormonal IUD inserted more than 7 days after your most recent period started, you will need to use a backup method of birth control for 7 days after IUD insertion. Ask your health care provider whether this applies to you.  Continue to check that the IUD is still in place by feeling for the string(s) after every menstrual period, or once a month.  Take over-the-counter and prescription medicines only as told by your health care provider.  Do not drive or use heavy machinery while taking prescription pain medicine.  Keep all follow-up visits as told by your health care provider. This is important. Contact a health care provider if:  You have bleeding that is heavier or lasts longer than a normal menstrual cycle.  You have a fever.  You have cramps or abdominal pain that get worse or do not get better with medicine.  You develop abdominal pain that is new or is not in the same area of earlier cramping and pain.  You feel lightheaded or weak.  You have abnormal or bad-smelling discharge from your vagina.  You have pain during sexual activity.   You have any of the following problems with your IUD string(s): ? The string bothers or hurts you or your sexual partner. ? You cannot feel the string. ? The string has gotten longer.  You can feel the IUD in your vagina.  You think you may be pregnant, or you miss your menstrual period.  You think you may have an STI (sexually transmitted infection). Get help right away if:  You have flu-like symptoms.  You have a fever and chills.  You can feel that your IUD has slipped out of place. Summary  After the procedure, it is common to have cramps and pain in the abdomen. It is also common to have light bleeding (spotting) or heavier bleeding that is like your menstrual period.  Continue to check that the IUD is still in place by feeling for the string(s) after every menstrual period, or once a month.  Keep all follow-up visits as told by your health care provider. This is important.  Contact your health care provider if you have problems with your IUD string(s), such as the string getting longer or bothering you or your sexual partner. This information is not intended to replace advice given to you by your health care provider. Make sure you discuss any questions you have with your health care provider. Document Released: 01/10/2011 Document Revised: 04/26/2017 Document Reviewed: 04/04/2016 Elsevier Patient Education  2020 [employer].

## 2019-05-06 NOTE — Telephone Encounter (Signed)
Patient needs 4 week IUD check with Dr. Quincy Simmonds. Offered 06/08/19, but patient unavailable. No other appointments available.

## 2019-05-08 ENCOUNTER — Other Ambulatory Visit: Payer: Self-pay

## 2019-05-08 ENCOUNTER — Encounter (HOSPITAL_BASED_OUTPATIENT_CLINIC_OR_DEPARTMENT_OTHER): Payer: Self-pay | Admitting: Orthopedic Surgery

## 2019-05-11 NOTE — Progress Notes (Signed)
      Enhanced Recovery after Surgery for Orthopedics Enhanced Recovery after Surgery is a protocol used to improve the stress on your body and your recovery after surgery.  Patient Instructions  . The night before surgery:  o No food after midnight. ONLY clear liquids after midnight  . The day of surgery (if you do NOT have diabetes):  o Drink ONE (1) Pre-Surgery Clear Ensure as directed.   o This drink was given to you during your hospital  pre-op appointment visit. o The pre-op nurse will instruct you on the time to drink the  Pre-Surgery Ensure depending on your surgery time. o Finish the drink at the designated time by the pre-op nurse.  o Nothing else to drink after completing the  Pre-Surgery Clear Ensure.  . The day of surgery (if you have diabetes): o Drink ONE (1) Gatorade 2 (G2) as directed. o This drink was given to you during your hospital  pre-op appointment visit.  o The pre-op nurse will instruct you on the time to drink the   Gatorade 2 (G2) depending on your surgery time. o Color of the Gatorade may vary. Red is not allowed. o Nothing else to drink after completing the  Gatorade 2 (G2).         If you have questions, please contact your surgeon's office.  Finish by 1000 DOS.

## 2019-05-12 ENCOUNTER — Ambulatory Visit: Payer: Self-pay | Admitting: Obstetrics and Gynecology

## 2019-05-12 ENCOUNTER — Other Ambulatory Visit (HOSPITAL_COMMUNITY)
Admission: RE | Admit: 2019-05-12 | Discharge: 2019-05-12 | Disposition: A | Discharge status: Home / Self Care | Payer: Managed Care, Other (non HMO) | Source: Ambulatory Visit | Attending: Orthopedic Surgery | Admitting: Orthopedic Surgery

## 2019-05-12 DIAGNOSIS — Z01812 Encounter for preprocedural laboratory examination: Secondary | ICD-10-CM | POA: Insufficient documentation

## 2019-05-12 DIAGNOSIS — Z20828 Contact with and (suspected) exposure to other viral communicable diseases: Secondary | ICD-10-CM | POA: Insufficient documentation

## 2019-05-13 LAB — NOVEL CORONAVIRUS, NAA (HOSP ORDER, SEND-OUT TO REF LAB; TAT 18-24 HRS): SARS-CoV-2, NAA: NOT DETECTED

## 2019-05-15 ENCOUNTER — Ambulatory Visit (HOSPITAL_BASED_OUTPATIENT_CLINIC_OR_DEPARTMENT_OTHER): Payer: No Typology Code available for payment source | Admitting: Anesthesiology

## 2019-05-15 ENCOUNTER — Telehealth: Payer: Self-pay | Admitting: Licensed Clinical Social Worker

## 2019-05-15 ENCOUNTER — Ambulatory Visit (HOSPITAL_BASED_OUTPATIENT_CLINIC_OR_DEPARTMENT_OTHER)
Admission: RE | Admit: 2019-05-15 | Discharge: 2019-05-15 | Disposition: A | Discharge status: Home / Self Care | Payer: No Typology Code available for payment source | Attending: Orthopedic Surgery | Admitting: Orthopedic Surgery

## 2019-05-15 ENCOUNTER — Encounter (HOSPITAL_BASED_OUTPATIENT_CLINIC_OR_DEPARTMENT_OTHER)
Admission: RE | Disposition: A | Discharge status: Home / Self Care | Payer: Self-pay | Source: Home / Self Care | Attending: Orthopedic Surgery

## 2019-05-15 ENCOUNTER — Encounter (HOSPITAL_BASED_OUTPATIENT_CLINIC_OR_DEPARTMENT_OTHER): Payer: Self-pay | Admitting: Orthopedic Surgery

## 2019-05-15 ENCOUNTER — Other Ambulatory Visit: Payer: Self-pay

## 2019-05-15 DIAGNOSIS — Z87891 Personal history of nicotine dependence: Secondary | ICD-10-CM | POA: Insufficient documentation

## 2019-05-15 DIAGNOSIS — G4730 Sleep apnea, unspecified: Secondary | ICD-10-CM | POA: Diagnosis not present

## 2019-05-15 DIAGNOSIS — Z79810 Long term (current) use of selective estrogen receptor modulators (SERMs): Secondary | ICD-10-CM | POA: Insufficient documentation

## 2019-05-15 DIAGNOSIS — G5602 Carpal tunnel syndrome, left upper limb: Secondary | ICD-10-CM | POA: Insufficient documentation

## 2019-05-15 HISTORY — PX: CARPAL TUNNEL RELEASE: SHX101

## 2019-05-15 SURGERY — CARPAL TUNNEL RELEASE
Anesthesia: Regional | Site: Hand | Laterality: Left

## 2019-05-15 MED ORDER — ONDANSETRON HCL 4 MG/2ML IJ SOLN
INTRAMUSCULAR | Status: AC
  Filled 2019-05-15: qty 2

## 2019-05-15 MED ORDER — FENTANYL CITRATE (PF) 100 MCG/2ML IJ SOLN
INTRAMUSCULAR | Status: DC | PRN
  Administered 2019-05-15 (×2): 50 ug via INTRAVENOUS

## 2019-05-15 MED ORDER — MIDAZOLAM HCL 5 MG/5ML IJ SOLN
INTRAMUSCULAR | Status: DC | PRN
  Administered 2019-05-15: 2 mg via INTRAVENOUS

## 2019-05-15 MED ORDER — FENTANYL CITRATE (PF) 100 MCG/2ML IJ SOLN: 25.0000 ug | INTRAMUSCULAR | Status: DC | PRN

## 2019-05-15 MED ORDER — BUPIVACAINE HCL (PF) 0.25 % IJ SOLN
INTRAMUSCULAR | Status: DC | PRN
  Administered 2019-05-15: 10 mL

## 2019-05-15 MED ORDER — ACETAMINOPHEN 500 MG PO TABS
ORAL_TABLET | ORAL | Status: AC
  Filled 2019-05-15: qty 1

## 2019-05-15 MED ORDER — PROPOFOL 10 MG/ML IV BOLUS
INTRAVENOUS | Status: DC | PRN
  Administered 2019-05-15: 100 mg via INTRAVENOUS
  Administered 2019-05-15: 200 mg via INTRAVENOUS

## 2019-05-15 MED ORDER — CHLORHEXIDINE GLUCONATE 4 % EX LIQD: 60.0000 mL | Freq: Once | CUTANEOUS | Status: DC

## 2019-05-15 MED ORDER — MIDAZOLAM HCL 2 MG/2ML IJ SOLN
INTRAMUSCULAR | Status: AC
  Filled 2019-05-15: qty 2

## 2019-05-15 MED ORDER — OXYCODONE-ACETAMINOPHEN 5-325 MG PO TABS: ORAL_TABLET | ORAL | 0 refills | Status: DC

## 2019-05-15 MED ORDER — LIDOCAINE 2% (20 MG/ML) 5 ML SYRINGE
INTRAMUSCULAR | Status: DC | PRN
  Administered 2019-05-15: 60 mg via INTRAVENOUS

## 2019-05-15 MED ORDER — DEXAMETHASONE SODIUM PHOSPHATE 10 MG/ML IJ SOLN
INTRAMUSCULAR | Status: AC
  Filled 2019-05-15: qty 1

## 2019-05-15 MED ORDER — PROPOFOL 10 MG/ML IV BOLUS
INTRAVENOUS | Status: AC
  Filled 2019-05-15: qty 20

## 2019-05-15 MED ORDER — DEXAMETHASONE SODIUM PHOSPHATE 10 MG/ML IJ SOLN
INTRAMUSCULAR | Status: DC | PRN
  Administered 2019-05-15: 10 mg via INTRAVENOUS

## 2019-05-15 MED ORDER — LACTATED RINGERS IV SOLN: INTRAVENOUS | Status: DC

## 2019-05-15 MED ORDER — PROPOFOL 500 MG/50ML IV EMUL: INTRAVENOUS | Status: DC | PRN

## 2019-05-15 MED ORDER — CEFAZOLIN SODIUM-DEXTROSE 2-4 GM/100ML-% IV SOLN
2.0000 g | INTRAVENOUS | Status: AC
  Administered 2019-05-15: 2 g via INTRAVENOUS

## 2019-05-15 MED ORDER — CELECOXIB 200 MG PO CAPS
ORAL_CAPSULE | ORAL | Status: AC
  Filled 2019-05-15: qty 2

## 2019-05-15 MED ORDER — ACETAMINOPHEN 500 MG PO TABS
1000.0000 mg | ORAL_TABLET | Freq: Once | ORAL | Status: AC
  Administered 2019-05-15: 1000 mg via ORAL

## 2019-05-15 MED ORDER — CEFAZOLIN SODIUM-DEXTROSE 2-4 GM/100ML-% IV SOLN
INTRAVENOUS | Status: AC
  Filled 2019-05-15: qty 100

## 2019-05-15 MED ORDER — FENTANYL CITRATE (PF) 100 MCG/2ML IJ SOLN
INTRAMUSCULAR | Status: AC
  Filled 2019-05-15: qty 2

## 2019-05-15 MED ORDER — OXYCODONE HCL 5 MG PO TABS
5.0000 mg | ORAL_TABLET | Freq: Once | ORAL | Status: AC
  Administered 2019-05-15: 5 mg via ORAL

## 2019-05-15 MED ORDER — OXYCODONE HCL 5 MG PO TABS
ORAL_TABLET | ORAL | Status: AC
  Filled 2019-05-15: qty 1

## 2019-05-15 MED ORDER — CELECOXIB 400 MG PO CAPS
400.0000 mg | ORAL_CAPSULE | Freq: Once | ORAL | Status: AC
  Administered 2019-05-15: 13:00:00 400 mg via ORAL

## 2019-05-15 MED ORDER — ONDANSETRON HCL 4 MG/2ML IJ SOLN
INTRAMUSCULAR | Status: DC | PRN
  Administered 2019-05-15: 4 mg via INTRAVENOUS

## 2019-05-15 MED ORDER — PROMETHAZINE HCL 25 MG/ML IJ SOLN: 6.2500 mg | INTRAMUSCULAR | Status: DC | PRN

## 2019-05-15 SURGICAL SUPPLY — 40 items
APL PRP STRL LF DISP 70% ISPRP (MISCELLANEOUS) ×1
BLADE SURG 15 STRL LF DISP TIS (BLADE) ×2 IMPLANT
BLADE SURG 15 STRL SS (BLADE) ×4
BNDG CMPR 9X4 STRL LF SNTH (GAUZE/BANDAGES/DRESSINGS) ×1
BNDG ELASTIC 3X5.8 VLCR STR LF (GAUZE/BANDAGES/DRESSINGS) ×2 IMPLANT
BNDG ESMARK 4X9 LF (GAUZE/BANDAGES/DRESSINGS) ×1 IMPLANT
BNDG GAUZE ELAST 4 BULKY (GAUZE/BANDAGES/DRESSINGS) ×2 IMPLANT
CHLORAPREP W/TINT 26 (MISCELLANEOUS) ×2 IMPLANT
CORD BIPOLAR FORCEPS 12FT (ELECTRODE) ×2 IMPLANT
COVER BACK TABLE REUSABLE LG (DRAPES) ×2 IMPLANT
COVER MAYO STAND REUSABLE (DRAPES) ×2 IMPLANT
COVER WAND RF STERILE (DRAPES) IMPLANT
CUFF TOURN SGL QUICK 18X4 (TOURNIQUET CUFF) ×2 IMPLANT
DRAPE EXTREMITY T 121X128X90 (DISPOSABLE) ×2 IMPLANT
DRAPE SURG 17X23 STRL (DRAPES) ×2 IMPLANT
DRSG PAD ABDOMINAL 8X10 ST (GAUZE/BANDAGES/DRESSINGS) ×2 IMPLANT
GAUZE SPONGE 4X4 12PLY STRL (GAUZE/BANDAGES/DRESSINGS) ×2 IMPLANT
GAUZE XEROFORM 1X8 LF (GAUZE/BANDAGES/DRESSINGS) ×2 IMPLANT
GLOVE BIO SURGEON STRL SZ7.5 (GLOVE) ×2 IMPLANT
GLOVE BIOGEL PI IND STRL 7.0 (GLOVE) IMPLANT
GLOVE BIOGEL PI IND STRL 8 (GLOVE) ×1 IMPLANT
GLOVE BIOGEL PI INDICATOR 7.0 (GLOVE) ×1
GLOVE BIOGEL PI INDICATOR 8 (GLOVE) ×1
GLOVE ECLIPSE 6.5 STRL STRAW (GLOVE) ×1 IMPLANT
GLOVE EXAM NITRILE MD LF STRL (GLOVE) ×1 IMPLANT
GOWN STRL REUS W/ TWL LRG LVL3 (GOWN DISPOSABLE) ×1 IMPLANT
GOWN STRL REUS W/TWL LRG LVL3 (GOWN DISPOSABLE) ×4
GOWN STRL REUS W/TWL XL LVL3 (GOWN DISPOSABLE) ×2 IMPLANT
NDL HYPO 25X1 1.5 SAFETY (NEEDLE) ×1 IMPLANT
NEEDLE HYPO 25X1 1.5 SAFETY (NEEDLE) ×2 IMPLANT
NS IRRIG 1000ML POUR BTL (IV SOLUTION) ×2 IMPLANT
PACK BASIN DAY SURGERY FS (CUSTOM PROCEDURE TRAY) ×2 IMPLANT
PADDING CAST ABS 4INX4YD NS (CAST SUPPLIES)
PADDING CAST ABS COTTON 4X4 ST (CAST SUPPLIES) ×1 IMPLANT
STOCKINETTE 4X48 STRL (DRAPES) ×2 IMPLANT
SUT ETHILON 4 0 PS 2 18 (SUTURE) ×2 IMPLANT
SYR BULB 3OZ (MISCELLANEOUS) ×2 IMPLANT
SYR CONTROL 10ML LL (SYRINGE) ×2 IMPLANT
TOWEL GREEN STERILE FF (TOWEL DISPOSABLE) ×4 IMPLANT
UNDERPAD 30X36 HEAVY ABSORB (UNDERPADS AND DIAPERS) ×2 IMPLANT

## 2019-05-15 NOTE — Anesthesia Preprocedure Evaluation (Addendum)
Anesthesia Evaluation  Patient identified by MRN, date of birth, ID band Patient awake    Reviewed: Allergy & Precautions, NPO status , Patient's Chart, lab work & pertinent test results  History of Anesthesia Complications Negative for: history of anesthetic complications  Airway Mallampati: III  TM Distance: >3 FB Neck ROM: Full    Dental no notable dental hx. (+) Dental Advisory Given   Pulmonary sleep apnea and Continuous Positive Airway Pressure Ventilation , Patient abstained from smoking., former smoker,    Pulmonary exam normal        Cardiovascular negative cardio ROS Normal cardiovascular exam     Neuro/Psych PSYCHIATRIC DISORDERS Depression negative neurological ROS     GI/Hepatic negative GI ROS, Neg liver ROS,   Endo/Other  PCOS  Renal/GU negative Renal ROS  negative genitourinary   Musculoskeletal negative musculoskeletal ROS (+)   Abdominal   Peds negative pediatric ROS (+)  Hematology negative hematology ROS (+)   Anesthesia Other Findings   Reproductive/Obstetrics PCOS                            Anesthesia Physical  Anesthesia Plan  ASA: III  Anesthesia Plan: General   Post-op Pain Management:    Induction: Intravenous  PONV Risk Score and Plan: 2 and Ondansetron, Midazolam and Treatment may vary due to age or medical condition  Airway Management Planned: LMA  Additional Equipment:   Intra-op Plan:   Post-operative Plan: Extubation in OR  Informed Consent: I have reviewed the patients History and Physical, chart, labs and discussed the procedure including the risks, benefits and alternatives for the proposed anesthesia with the patient or authorized representative who has indicated his/her understanding and acceptance.     Dental advisory given  Plan Discussed with: CRNA, Anesthesiologist and Surgeon  Anesthesia Plan Comments:        Anesthesia  Quick Evaluation

## 2019-05-15 NOTE — Anesthesia Postprocedure Evaluation (Signed)
Anesthesia Post Note  Patient: [employer]  Procedure(s) Performed: LEFT CARPAL TUNNEL RELEASE (Left Hand)     Patient location during evaluation: PACU Anesthesia Type: General Level of consciousness: sedated Pain management: pain level controlled Vital Signs Assessment: post-procedure vital signs reviewed and stable Respiratory status: spontaneous breathing and respiratory function stable Cardiovascular status: stable Postop Assessment: no apparent nausea or vomiting Anesthetic complications: no    Last Vitals:  Vitals:   05/15/19 1500 05/15/19 1515  BP: 123/88 117/88  Pulse: 81 83  Resp: 17 16  Temp:  37 C  SpO2: 96% 96%    Last Pain:  Vitals:   05/15/19 1515  TempSrc:   PainSc: [place]

## 2019-05-15 NOTE — Anesthesia Procedure Notes (Signed)
Procedure Name: LMA Insertion Date/Time: 05/15/2019 1:48 PM Performed by: Lieutenant Diego, CRNA Pre-anesthesia Checklist: Patient identified, Emergency Drugs available, Suction available and Patient being monitored Patient Re-evaluated:Patient Re-evaluated prior to induction Oxygen Delivery Method: Circle system utilized Preoxygenation: Pre-oxygenation with 100% oxygen Induction Type: IV induction Ventilation: Mask ventilation without difficulty LMA: LMA inserted LMA Size: 4.0 Number of attempts: 1 Placement Confirmation: positive ETCO2 and breath sounds checked- equal and bilateral Tube secured with: Tape Dental Injury: Teeth and Oropharynx as per pre-operative assessment

## 2019-05-15 NOTE — Op Note (Signed)
05/15/2019 [facility]                              OPERATIVE REPORT   PREOPERATIVE DIAGNOSIS:  Left carpal tunnel syndrome.  POSTOPERATIVE DIAGNOSIS:  Left carpal tunnel syndrome.  PROCEDURE:  Left carpal tunnel release.  SURGEON:  Leanora Cover, MD  ASSISTANT:  none.  ANESTHESIA: General  IV FLUIDS:  Per anesthesia flow sheet.  ESTIMATED BLOOD LOSS:  Minimal.  COMPLICATIONS:  None.  SPECIMENS:  None.  TOURNIQUET TIME:    Total Tourniquet Time Documented: Forearm (Left) - 19 minutes Total: Forearm (Left) - 19 minutes   DISPOSITION:  Stable to PACU.  LOCATION: [facility]  INDICATIONS:  43 yo female with numbness and tingling left hand.  Nocturnal symptoms.  Positive nerve conduction studies.  She wishes to have a carpal tunnel release for management of her symptoms.  Risks, benefits and alternatives of surgery were discussed including the risk of blood loss; infection; damage to nerves, vessels, tendons, ligaments, bone; failure of surgery; need for additional surgery; complications with wound healing; continued pain; recurrence of carpal tunnel syndrome; and damage to motor branch. She voiced understanding of these risks and elected to proceed.   OPERATIVE COURSE:  After being identified preoperatively by myself, the patient and I agreed upon the procedure and site of procedure.  The surgical site was marked.  The risks, benefits, and alternatives of the surgery were reviewed and she wished to proceed.  Surgical consent had been signed.  She was given IV Ancef as preoperative antibiotic prophylaxis.  She was transferred to the operating room and placed on the operating room table in supine position with the Left upper extremity on an armboard.  General anesthesia was induced by the anesthesiologist.  Left upper extremity was prepped and draped in normal sterile orthopaedic fashion.  A surgical pause was performed between the surgeons, anesthesia, and  operating room staff, and all were in agreement as to the patient, procedure, and site of procedure.  Tourniquet at the proximal aspect of the forearm was inflated to 250 mmHg after exsanguination of the arm with an Esmarch bandage  Incision was made over the transverse carpal ligament and carried into the subcutaneous tissues by spreading technique.  Bipolar electrocautery was used to obtain hemostasis.  The palmar fascia was sharply incised.  The transverse carpal ligament was identified and sharply incised.  It was incised distally first.  Care was taken to ensure complete decompression distally.  It was then incised proximally.  Scissors were used to split the distal aspect of the volar antebrachial fascia.  A finger was placed into the wound to ensure complete decompression, which was the case.  The nerve was examined.  It was adherent to the radial leaflet.  The motor branch was identified and was intact.  The wound was copiously irrigated with sterile saline.  It was then closed with 4-0 nylon in a horizontal mattress fashion.  It was injected with 0.25% plain Marcaine to aid in postoperative analgesia.  It was dressed with sterile Xeroform, 4x4s, an ABD, and wrapped with Kerlix and an Ace bandage.  Tourniquet was deflated at 19 minutes.  Fingertips were pink with brisk capillary refill after deflation of the tourniquet.  Operative drapes were broken down.  The patient was awoken from anesthesia safely.  She was transferred back to stretcher and taken to the PACU in stable condition.  I will  see her back in the office in 1 week for postoperative followup.  I will give her a prescription for Percocet 5/325 1-2 tabs PO q6 hours prn pain, dispense # 20.    Leanora Cover, MD Electronically signed, 05/15/19

## 2019-05-15 NOTE — Transfer of Care (Signed)
Immediate Anesthesia Transfer of Care Note  Patient: [facility]  Procedure(s) Performed: LEFT CARPAL TUNNEL RELEASE (Left Hand)  Patient Location: PACU  Anesthesia Type:General  Level of Consciousness: awake  Airway & Oxygen Therapy: Patient Spontanous Breathing and Patient connected to face mask oxygen  Post-op Assessment: Report given to RN and Post -op Vital signs reviewed and stable  Post vital signs: Reviewed and stable  Last Vitals:  Vitals Value Taken Time  BP 117/90 05/15/19 1430  Temp    Pulse 96 05/15/19 1432  Resp 16 05/15/19 1432  SpO2 94 % 05/15/19 1432    Last Pain:  Vitals:   05/15/19 1303  TempSrc: Tympanic  PainSc: 0-No pain      Patients Stated Pain Goal: 7 ([phone])  Complications: No apparent anesthesia complications

## 2019-05-15 NOTE — Discharge Instructions (Addendum)

## 2019-05-15 NOTE — H&P (Signed)
Kristy Stafford is an 43 y.o. female.   Chief Complaint: left carpal tunnel syndrome HPI: 43 yo female with numbness and tingling left hand.  Positive nerve conduction studies.  She has nocturnal symptoms.  She wishes to have a carpal tunnel release.  Allergies: No Known Allergies  Past Medical History:  Diagnosis Date  . Anemia   . Breast mass, left 10/10/2012   Excised 11/24/12, papilloma on path   . 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 Depression   . 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 Family history of breast cancer   . Family history of ovarian cancer   . Family history of pancreatic cancer   . Family history of prostate cancer   . High anti-Mullerian hormone 2020  . Hyperlipidemia   . Medical history non-contributory   . Nipple discharge    left breast  . PCOS (polycystic ovarian syndrome)   . Sleep apnea    has a cpap for 3 yr-told she will need to bring day of surgery and use post op    Past Surgical History:  Procedure Laterality Date  . BREAST DUCTAL SYSTEM EXCISION Left 11/24/2012   Procedure: EXCISION DUCTAL SYSTEM BREAST;  Surgeon: Haywood Lasso, MD;  Location: [place];  Service: General;  Laterality: Left;  . BREAST EXCISIONAL BIOPSY Left 2014  . BREAST LUMPECTOMY WITH NEEDLE LOCALIZATION Right 03/13/2019   Procedure: RIGHT BREAST LUMPECTOMY WITH NEEDLE LOCALIZATION X2;  Surgeon: Fanny Skates, MD;  Location: [place];  Service: General;  Laterality: Right;  . BREAST LUMPECTOMY WITH RADIOACTIVE SEED LOCALIZATION Right 03/13/2019   Procedure: RIGHT  BREAST LUMPECTOMY WITH RADIOACTIVE SEED LOCALIZATION;  Surgeon: Fanny Skates, MD;  Location: [place];  Service: General;  Laterality: Right;  . CARPAL TUNNEL RELEASE Right 04/20/2019   Procedure: RIGHT CARPAL TUNNEL RELEASE;  Surgeon: Leanora Cover, MD;  Location: [place];  Service: Orthopedics;  Laterality:  Right;  . DILATATION & CURETTAGE/HYSTEROSCOPY WITH MYOSURE N/A 03/03/2019   Procedure: DILATATION & CURETTAGE/HYSTEROSCOPY;  Surgeon: Nunzio Cobbs, MD;  Location: [facility];  Service: Gynecology;  Laterality: N/A;  . NO PAST SURGERIES      Family History: Family History  Problem Relation Age of Onset  . Cancer Mother 73       breast  . Breast cancer Mother   . Diabetes Father   . Cancer Maternal Grandmother        breast--dec age 20  . Breast cancer Maternal Grandmother   . Cancer Maternal Aunt        unsure type, possibly pancreatic  . Ovarian cancer Paternal Grandmother 9  . Prostate cancer Paternal Uncle 35    Social History:   reports that she has quit smoking. She has never used smokeless tobacco. She reports previous alcohol use. She reports that she does not use drugs.  Medications: Medications Prior to Admission  Medication Sig Dispense Refill  . tamoxifen (NOLVADEX) 10 MG tablet Take 1 tablet (10 mg total) by mouth daily. 90 tablet 3    No results found for this or any previous visit (from the past 48 hour(s)).  No results found.   A comprehensive review of systems was negative.  Blood pressure 122/85, pulse 87, temperature (!) 96.9 F (36.1 C), temperature source Tympanic, resp. rate 18, height 5\' 3"  (1.6 m), weight 95.7 kg, SpO2 99 %.  General appearance: alert, cooperative and appears stated age Head:  Normocephalic, without obvious abnormality, atraumatic Neck: supple, symmetrical, trachea midline Cardio: regular rate and rhythm Resp: clear to auscultation bilaterally Extremities: Intact sensation and capillary refill all digits.  +epl/fpl/io.  No wounds.  Pulses: 2+ and symmetric Skin: Skin color, texture, turgor normal. No rashes or lesions Neurologic: Grossly normal Incision/Wound: none  Assessment/Plan Left carpal tunnel syndrome.  Non operative and operative treatment options have been discussed with the patient and  patient wishes to proceed with operative treatment. Risks, benefits, and alternatives of surgery have been discussed and the patient agrees with the plan of care.   Leanora Cover 05/15/2019, 1:39 PM

## 2019-05-18 ENCOUNTER — Encounter: Payer: Self-pay | Admitting: *Deleted

## 2019-05-20 ENCOUNTER — Ambulatory Visit: Payer: Self-pay | Admitting: Licensed Clinical Social Worker

## 2019-05-20 ENCOUNTER — Encounter: Payer: Self-pay | Admitting: Licensed Clinical Social Worker

## 2019-05-20 DIAGNOSIS — Z8041 Family history of malignant neoplasm of ovary: Secondary | ICD-10-CM

## 2019-05-20 DIAGNOSIS — Z1379 Encounter for other screening for genetic and chromosomal anomalies: Secondary | ICD-10-CM | POA: Insufficient documentation

## 2019-05-20 DIAGNOSIS — Z8042 Family history of malignant neoplasm of prostate: Secondary | ICD-10-CM

## 2019-05-20 DIAGNOSIS — N6011 Diffuse cystic mastopathy of right breast: Secondary | ICD-10-CM

## 2019-05-20 DIAGNOSIS — Z803 Family history of malignant neoplasm of breast: Secondary | ICD-10-CM

## 2019-05-20 DIAGNOSIS — Z800 Family history of malignant neoplasm of digestive organs: Secondary | ICD-10-CM

## 2019-05-20 NOTE — Progress Notes (Signed)
HPI:  Kristy Stafford was previously seen in the [place] clinic due to a family history of cancer and concerns regarding a hereditary predisposition to cancer. Please refer to our prior cancer genetics clinic note for more information regarding our discussion, assessment and recommendations, at the time. Kristy Stafford recent genetic test results were disclosed to her, as were recommendations warranted by these results. These results and recommendations are discussed in more detail below.  CANCER HISTORY:  Oncology History   No history exists.    FAMILY HISTORY:  We obtained a detailed, 4-generation family history.  Significant diagnoses are listed below: Family History  Problem Relation Age of Onset  . Cancer Mother 6       breast  . Breast cancer Mother   . Diabetes Father   . Cancer Maternal Grandmother        breast--dec age 65  . Breast cancer Maternal Grandmother   . Cancer Maternal Aunt        unsure type, possibly pancreatic  . Ovarian cancer Paternal Grandmother 29  . Prostate cancer Paternal Uncle 23     Kristy Stafford does not have children. She has 3 full sisters, 2 are twins. She has one paternal half brother. None of her siblings have had cancer.  Kristy Stafford mother was diagnosed with breast cancer at 42 and died at 47, this was metastatic cancer. Kristy Stafford had 6 maternal aunts and 1 maternal uncle. One of her aunts had cancer, possibly pancreatic, and passed away. No known cancers in maternal cousins. Maternal grandmother had breast cancer and died at 47. Her grandmother's sister had pancreatic cancer in her 34s. Maternal grandfather died young, she has limited information about him.   Kristy Stafford father is living at 22. Patient has 1 paternal uncle, he had prostate cancer at 16. She does not have paternal first cousins. Paternal grandmother had ovarian cancer diagnosed at 75 and died at 56. She does not have information about paternal grandfather.  [name] is unaware of previous family history of genetic testing for hereditary cancer risks. Patient's maternal ancestors are of Black/African American descent, and paternal ancestors are of Black/African American descent. There is no reported Ashkenazi Jewish ancestry. There is no known consanguinity.  GENETIC TEST RESULTS: Genetic testing reported out on 05/15/2019 through the [facility] Multi-Cancer cancer panel found no pathogenic mutations.   The Multi-Cancer Panel offered by [facility] includes sequencing and/or deletion duplication testing of the following 85 genes: AIP, ALK, APC, ATM, AXIN2,BAP1,  BARD1, BLM, BMPR1A, BRCA1, BRCA2, BRIP1, CASR, CDC73, CDH1, CDK4, CDKN1B, CDKN1C, CDKN2A (p14ARF), CDKN2A (p16INK4a), CEBPA, CHEK2, CTNNA1, DICER1, DIS3L2, EGFR (c.2369C>T, p.Thr790Met variant only), EPCAM (Deletion/duplication testing only), FH, FLCN, GATA2, GPC3, GREM1 (Promoter region deletion/duplication testing only), HOXB13 (c.251G>A, p.Gly84Glu), HRAS, KIT, MAX, MEN1, MET, MITF (c.952G>A, p.Glu318Lys variant only), MLH1, MSH2, MSH3, MSH6, MUTYH, NBN, NF1, NF2, NTHL1, PALB2, PDGFRA, PHOX2B, PMS2, POLD1, POLE, POT1, PRKAR1A, PTCH1, PTEN, RAD50, RAD51C, RAD51D, RB1, RECQL4, RET, RNF43, RUNX1, SDHAF2, SDHA (sequence changes only), SDHB, SDHC, SDHD, SMAD4, SMARCA4, SMARCB1, SMARCE1, STK11, SUFU, TERC, TERT, TMEM127, TP53, TSC1, TSC2, VHL, WRN and WT1.   The test report has been scanned into EPIC and is located under the Molecular Pathology section of the Results Review tab.  A portion of the result report is included below for reference.     We discussed with Kristy Stafford that because current genetic testing is not perfect, it is possible there may be a gene mutation in one of  these genes that current testing cannot detect, but that chance is small.  We also discussed, that there could be another gene that has not yet been discovered, or that we have not yet tested, that is responsible for the cancer diagnoses  in the family. It is also possible there is a hereditary cause for the cancer in the family that Kristy Stafford did not inherit and therefore was not identified in her testing.  Therefore, it is important to remain in touch with cancer genetics in the future so that we can continue to offer Kristy Stafford the most up to date genetic testing.   Genetic testing did identify 3 Variants of uncertain significance (VUS) - one in the DICER1 gene called c.493T>G, a second in the MSH3 gene called c.287C>T, and a third in the [facility] gene called c.3436G>A.  At this time, it is unknown if these variants are associated with increased cancer risk or if they are normal findings, but most variants such as these get reclassified to being inconsequential. They should not be used to make medical management decisions. With time, we suspect the lab will determine the significance of these variants, if any. If we do learn more about them, we will try to contact Kristy Stafford to discuss it further. However, it is important to stay in touch with [place] periodically and keep the address and phone number up to date.  ADDITIONAL GENETIC TESTING: We discussed with Kristy Stafford that her genetic testing was fairly extensive.  If there are genes identified to increase cancer risk that can be analyzed in the future, we would be happy to discuss and coordinate this testing at that time.    CANCER SCREENING RECOMMENDATIONS: Kristy Stafford test result is considered negative (normal).  This means that we have not identified a hereditary cause for her  family history of cancer at this time.  While reassuring, this does not definitively rule out a hereditary predisposition to cancer. It is still possible that there could be genetic mutations that are undetectable by current technology. There could be genetic mutations in genes that have not been tested or identified to increase cancer risk.  Therefore, it is recommended she continue to follow the cancer  management and screening guidelines provided by her oncology and primary healthcare provider. She is followed by Dr. Lindi Adie as high risk for breast cancer.  An individual's cancer risk and medical management are not determined by genetic test results alone. Overall cancer risk assessment incorporates additional factors, including personal medical history, family history, and any available genetic information that may result in a personalized plan for cancer prevention and surveillance.  RECOMMENDATIONS FOR FAMILY MEMBERS:  Relatives in this family might be at some increased risk of developing cancer, over the general population risk, simply due to the family history of cancer.  We recommended female relatives in this family have a yearly mammogram beginning at age 50, or 75 years younger than the earliest onset of cancer, an annual clinical breast exam, and perform monthly breast self-exams. Female relatives in this family should also have a gynecological exam as recommended by their primary provider. All family members should have a colonoscopy by age 2, or as directed by their physicians.   It is also possible there is a hereditary cause for the cancer in Kristy Stafford's family that she did not inherit and therefore was not identified in her.  Based on Kristy Stafford's family history, we recommended maternal and paternal relatives, including her sisters, have genetic  counseling and testing. Kristy Stafford will let us know if we can be of any assistance in coordinating genetic counseling and/or testing for these family members.  FOLLOW-UP: Lastly, we discussed with Kristy Stafford that cancer genetics is a rapidly advancing field and it is possible that new genetic tests will be appropriate for her and/or her family members in the future. We encouraged her to remain in contact with cancer genetics on an annual basis so we can update her personal and family histories and let her know of advances in cancer genetics that may  benefit this family.   Our contact number was provided. Kristy Stafford questions were answered to her satisfaction, and she knows she is welcome to call us at anytime with additional questions or concerns.   Faith Rogue, MS, [facility] Genetic Counselor [email or web address] Phone: [phone]'

## 2019-05-20 NOTE — Telephone Encounter (Signed)
Revealed negative genetic testing.  Revealed that a VUS in MSH3, SMARCA4, and DICER1 were identified. This normal result is reassuring.  It is unlikely that there is an increased risk of  cancer due to a mutation in one of these genes.  However, genetic testing is not perfect, and cannot definitively rule out a hereditary cause.  It will be important for her to keep in contact with genetics to learn if any additional testing may be needed in the future.

## 2019-05-27 ENCOUNTER — Telehealth: Payer: Self-pay | Admitting: Obstetrics and Gynecology

## 2019-05-27 NOTE — Telephone Encounter (Signed)
Left message on voicemail to call and reschedule cancelled appointment. °

## 2019-05-29 DIAGNOSIS — R7309 Other abnormal glucose: Secondary | ICD-10-CM

## 2019-05-29 HISTORY — DX: Other abnormal glucose: R73.09

## 2019-06-04 NOTE — Progress Notes (Deleted)
GYNECOLOGY  VISIT   HPI: 44 y.o.   Married  [place] American  female   [phone] with No LMP recorded. (Menstrual status: IUD).   here for 4 week follow up after Mirena IUD insertion.    GYNECOLOGIC HISTORY: No LMP recorded. (Menstrual status: IUD). Contraception:  Mirena IUD 05-06-19 Menopausal hormone therapy:  n/a Last mammogram: 12-17-18---See Epic  Last pap smear: 12/11/18 Neg:Neg HR HPV, 06-23-12 Neg:Neg HR HPV        OB History    Gravida  3   Para      Term      Preterm      AB  3   Living        SAB  3   TAB      Ectopic      Multiple      Live Births                 Patient Active Problem List   Diagnosis Date Noted  . Genetic testing 05/20/2019  . Family history of breast cancer   . Family history of ovarian cancer   . Family history of pancreatic cancer   . Family history of prostate cancer   . 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 Anemia   . Breast mass, left 10/10/2012   Excised 11/24/12, papilloma on path   . 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 Depression   . 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 Family history of breast cancer   . Family history of ovarian cancer   . Family history of pancreatic cancer   . Family history of prostate cancer   . High anti-Mullerian hormone 2020  . Hyperlipidemia   . Medical history non-contributory   . Nipple discharge    left breast  . PCOS (polycystic ovarian syndrome)   . Sleep apnea    has a cpap for 3 yr-told she will need to bring day of surgery and use post op    Past Surgical History:  Procedure Laterality Date  . BREAST DUCTAL SYSTEM EXCISION Left 11/24/2012   Procedure: EXCISION DUCTAL SYSTEM BREAST;  Surgeon: Haywood Lasso, MD;  Location: [place];  Service: General;  Laterality: Left;  . BREAST EXCISIONAL BIOPSY Left 2014  . BREAST  LUMPECTOMY WITH NEEDLE LOCALIZATION Right 03/13/2019   Procedure: RIGHT BREAST LUMPECTOMY WITH NEEDLE LOCALIZATION X2;  Surgeon: Fanny Skates, MD;  Location: [place];  Service: General;  Laterality: Right;  . BREAST LUMPECTOMY WITH RADIOACTIVE SEED LOCALIZATION Right 03/13/2019   Procedure: RIGHT  BREAST LUMPECTOMY WITH RADIOACTIVE SEED LOCALIZATION;  Surgeon: Fanny Skates, MD;  Location: [place];  Service: General;  Laterality: Right;  . CARPAL TUNNEL RELEASE Right 04/20/2019   Procedure: RIGHT CARPAL TUNNEL RELEASE;  Surgeon: Leanora Cover, MD;  Location: [place];  Service: Orthopedics;  Laterality: Right;  . CARPAL TUNNEL RELEASE Left 05/15/2019   Procedure: LEFT CARPAL TUNNEL RELEASE;  Surgeon: Leanora Cover, MD;  Location: [place];  Service: Orthopedics;  Laterality: Left;  . DILATATION & CURETTAGE/HYSTEROSCOPY WITH MYOSURE N/A 03/03/2019   Procedure: DILATATION & CURETTAGE/HYSTEROSCOPY;  Surgeon: Nunzio Cobbs, MD;  Location: [facility];  Service: Gynecology;  Laterality: N/A;  . NO PAST SURGERIES      Current Outpatient Medications  Medication Sig Dispense Refill  .  oxyCODONE-acetaminophen (PERCOCET) 5-325 MG tablet 1-2 tabs po q6 hours prn pain 20 tablet 0  . tamoxifen (NOLVADEX) 10 MG tablet Take 1 tablet (10 mg total) by mouth daily. 90 tablet 3   No current facility-administered medications for this visit.     ALLERGIES: Patient has no known allergies.  Family History  Problem Relation Age of Onset  . Cancer Mother 70       breast  . Breast cancer Mother   . Diabetes Father   . Cancer Maternal Grandmother        breast--dec age 32  . Breast cancer Maternal Grandmother   . Cancer Maternal Aunt        unsure type, possibly pancreatic  . Ovarian cancer Paternal Grandmother 18  . Prostate cancer Paternal Uncle 99    Social History   Socioeconomic History  . Marital  status: Married    Spouse name: Not on file  . Number of children: Not on file  . Years of education: Not on file  . Highest education level: Not on file  Occupational History  . Not on file  Tobacco Use  . Smoking status: Former Research scientist (life sciences)  . Smokeless tobacco: Never Used  . Tobacco comment: per patient smokes hemp smoked 04/18/19 per pt  Substance and Sexual Activity  . Alcohol use: Not Currently  . Drug use: No  . Sexual activity: Not Currently    Partners: Male    Birth control/protection: None, Abstinence  Other Topics Concern  . Not on file  Social History Narrative   Single, no children   Right handed   Some college   < 1 cup daily   Social Determinants of Health   Financial Resource Strain:   . Difficulty of Paying Living Expenses: Not on file  Food Insecurity:   . Worried About Charity fundraiser in the Last Year: Not on file  . Ran Out of Food in the Last Year: Not on file  Transportation Needs:   . Lack of Transportation (Medical): Not on file  . Lack of Transportation (Non-Medical): Not on file  Physical Activity:   . Days of Exercise per Week: Not on file  . Minutes of Exercise per Session: Not on file  Stress:   . Feeling of Stress : Not on file  Social Connections:   . Frequency of Communication with Friends and Family: Not on file  . Frequency of Social Gatherings with Friends and Family: Not on file  . Attends Religious Services: Not on file  . Active Member of Clubs or Organizations: Not on file  . Attends Archivist Meetings: Not on file  . Marital Status: Not on file  Intimate Partner Violence:   . Fear of Current or Ex-Partner: Not on file  . Emotionally Abused: Not on file  . Physically Abused: Not on file  . Sexually Abused: Not on file    Review of Systems  PHYSICAL EXAMINATION:    There were no vitals taken for this visit.    General appearance: alert, cooperative and appears stated age Head: Normocephalic, without obvious  abnormality, atraumatic Neck: no adenopathy, supple, symmetrical, trachea midline and thyroid normal to inspection and palpation Lungs: clear to auscultation bilaterally Breasts: normal appearance, no masses or tenderness, No nipple retraction or dimpling, No nipple discharge or bleeding, No axillary or supraclavicular adenopathy Heart: regular rate and rhythm Abdomen: soft, non-tender, no masses,  no organomegaly Extremities: extremities normal, atraumatic, no cyanosis or edema Skin: Skin  color, texture, turgor normal. No rashes or lesions Lymph nodes: Cervical, supraclavicular, and axillary nodes normal. No abnormal inguinal nodes palpated Neurologic: Grossly normal  Pelvic: External genitalia:  no lesions              Urethra:  normal appearing urethra with no masses, tenderness or lesions              Bartholins and Skenes: normal                 Vagina: normal appearing vagina with normal color and discharge, no lesions              Cervix: no lesions                Bimanual Exam:  Uterus:  normal size, contour, position, consistency, mobility, non-tender              Adnexa: no mass, fullness, tenderness              Rectal exam: {yes no:[id number]}.  Confirms.              Anus:  normal sphincter tone, no lesions  Chaperone was present for exam.  ASSESSMENT     PLAN     An After Visit Summary was printed and given to the patient.  ______ minutes face to face time of which over 50% was spent in counseling.

## 2019-06-05 ENCOUNTER — Ambulatory Visit: Payer: Self-pay | Admitting: Obstetrics and Gynecology

## 2019-06-08 ENCOUNTER — Ambulatory Visit: Payer: Self-pay | Admitting: Obstetrics and Gynecology

## 2019-06-09 NOTE — Progress Notes (Signed)
GYNECOLOGY  VISIT   HPI: 44 y.o.   Married  [place] American  female   [phone] with No LMP recorded. (Menstrual status: IUD).   here for follow up after Mirena IUD insertion.    Patient complaining of cramping a lot since IUD inserted and bleeding heavily since insertion. She is changing a pad every 1 - 2 hour or two.  Using both a tampon and a pad.  She is having cramping and using Ibuprofen 800 mg to treat cramping.  Not sexually active since IUD was placed.   She is now on Tamoxifen for reduction of breast cancer risk.  GYNECOLOGIC HISTORY: No LMP recorded. (Menstrual status: IUD). Contraception:  Mirena IUD 05-06-19 Menopausal hormone therapy:  none Last mammogram: 12-17-18---See Epic Last pap smear: 12/11/18 Neg:Neg HR HPV, 06-23-12 Neg:Neg HR HPV        OB History    Gravida  3   Para      Term      Preterm      AB  3   Living        SAB  3   TAB      Ectopic      Multiple      Live Births                 Patient Active Problem List   Diagnosis Date Noted  . Genetic testing 05/20/2019  . Family history of breast cancer   . Family history of ovarian cancer   . Family history of pancreatic cancer   . Family history of prostate cancer   . 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 Anemia   . Breast mass, left 10/10/2012   Excised 11/24/12, papilloma on path   . 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 Depression   . 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 Family history of breast cancer   . Family history of ovarian cancer   . Family history of pancreatic cancer   . Family history of prostate cancer   . High anti-Mullerian hormone 2020  . Hyperlipidemia   . Medical history non-contributory   . Nipple discharge    left breast  . PCOS (polycystic ovarian syndrome)   . Sleep apnea    has a cpap for 3 yr-told she will need  to bring day of surgery and use post op    Past Surgical History:  Procedure Laterality Date  . BREAST DUCTAL SYSTEM EXCISION Left 11/24/2012   Procedure: EXCISION DUCTAL SYSTEM BREAST;  Surgeon: Haywood Lasso, MD;  Location: [place];  Service: General;  Laterality: Left;  . BREAST EXCISIONAL BIOPSY Left 2014  . BREAST LUMPECTOMY WITH NEEDLE LOCALIZATION Right 03/13/2019   Procedure: RIGHT BREAST LUMPECTOMY WITH NEEDLE LOCALIZATION X2;  Surgeon: Fanny Skates, MD;  Location: [place];  Service: General;  Laterality: Right;  . BREAST LUMPECTOMY WITH RADIOACTIVE SEED LOCALIZATION Right 03/13/2019   Procedure: RIGHT  BREAST LUMPECTOMY WITH RADIOACTIVE SEED LOCALIZATION;  Surgeon: Fanny Skates, MD;  Location: [place];  Service: General;  Laterality: Right;  . CARPAL TUNNEL RELEASE Right 04/20/2019   Procedure: RIGHT CARPAL TUNNEL RELEASE;  Surgeon: Leanora Cover, MD;  Location: [facility];  Service: Orthopedics;  Laterality: Right;  . CARPAL TUNNEL RELEASE Left 05/15/2019   Procedure: LEFT CARPAL TUNNEL RELEASE;  Surgeon: Leanora Cover,  MD;  Location: [place];  Service: Orthopedics;  Laterality: Left;  . DILATATION & CURETTAGE/HYSTEROSCOPY WITH MYOSURE N/A 03/03/2019   Procedure: DILATATION & CURETTAGE/HYSTEROSCOPY;  Surgeon: Nunzio Cobbs, MD;  Location: [facility];  Service: Gynecology;  Laterality: N/A;  . NO PAST SURGERIES      Current Outpatient Medications  Medication Sig Dispense Refill  . ibuprofen (ADVIL) 800 MG tablet Take 800 mg by mouth every 8 (eight) hours as needed.    . tamoxifen (NOLVADEX) 10 MG tablet Take 1 tablet (10 mg total) by mouth daily. 90 tablet 3   No current facility-administered medications for this visit.     ALLERGIES: Patient has no known allergies.  Family History  Problem Relation Age of Onset  . Cancer Mother 40       breast  .  Breast cancer Mother   . Diabetes Father   . Cancer Maternal Grandmother        breast--dec age 27  . Breast cancer Maternal Grandmother   . Cancer Maternal Aunt        unsure type, possibly pancreatic  . Ovarian cancer Paternal Grandmother 58  . Prostate cancer Paternal Uncle 33    Social History   Socioeconomic History  . Marital status: Married    Spouse name: Not on file  . Number of children: Not on file  . Years of education: Not on file  . Highest education level: Not on file  Occupational History  . Not on file  Tobacco Use  . Smoking status: Former Research scientist (life sciences)  . Smokeless tobacco: Never Used  . Tobacco comment: per patient smokes hemp smoked 04/18/19 per pt  Substance and Sexual Activity  . Alcohol use: Not Currently  . Drug use: No  . Sexual activity: Not Currently    Partners: Male    Birth control/protection: None, Abstinence  Other Topics Concern  . Not on file  Social History Narrative   Single, no children   Right handed   Some college   < 1 cup daily   Social Determinants of Health   Financial Resource Strain:   . Difficulty of Paying Living Expenses: Not on file  Food Insecurity:   . Worried About Charity fundraiser in the Last Year: Not on file  . Ran Out of Food in the Last Year: Not on file  Transportation Needs:   . Lack of Transportation (Medical): Not on file  . Lack of Transportation (Non-Medical): Not on file  Physical Activity:   . Days of Exercise per Week: Not on file  . Minutes of Exercise per Session: Not on file  Stress:   . Feeling of Stress : Not on file  Social Connections:   . Frequency of Communication with Friends and Family: Not on file  . Frequency of Social Gatherings with Friends and Family: Not on file  . Attends Religious Services: Not on file  . Active Member of Clubs or Organizations: Not on file  . Attends Archivist Meetings: Not on file  . Marital Status: Not on file  Intimate Partner Violence:   .  Fear of Current or Ex-Partner: Not on file  . Emotionally Abused: Not on file  . Physically Abused: Not on file  . Sexually Abused: Not on file    Review of Systems  All other systems reviewed and are negative.   PHYSICAL EXAMINATION:    BP 118/70 (Cuff Size: Large)   Pulse 80  Temp (!) 96 F (35.6 C) (Temporal)   Ht 5' 3.25" (1.607 m)   Wt 216 lb (98 kg)   BMI 37.96 kg/m     General appearance: alert, cooperative and appears stated age Head: Normocephalic, without obvious abnormality, atraumatic Neck: no adenopathy, supple, symmetrical, trachea midline and thyroid normal to inspection and palpation Lungs: clear to auscultation bilaterally Breasts: normal appearance, no masses or tenderness, No nipple retraction or dimpling, No nipple discharge or bleeding, No axillary or supraclavicular adenopathy Heart: regular rate and rhythm Abdomen: soft, non-tender, no masses,  no organomegaly Extremities: extremities normal, atraumatic, no cyanosis or edema Skin: Skin color, texture, turgor normal. No rashes or lesions Lymph nodes: Cervical, supraclavicular, and axillary nodes normal. No abnormal inguinal nodes palpated Neurologic: Grossly normal  Pelvic: External genitalia:  no lesions              Urethra:  normal appearing urethra with no masses, tenderness or lesions              Bartholins and Skenes: normal                 Vagina: normal appearing vagina with normal color and discharge, no lesions              Cervix: no lesions.  IUD stings noted and trimmed.                Bimanual Exam:  Uterus:  normal size, contour, position, consistency, mobility, non-tender              Adnexa: no mass, fullness, tenderness            Chaperone was present for exam.  ASSESSMENT  Mirena IUD check up.  Menorrhagia with irregular menses.  Hx oligomenorrhea and hyperplasia with office EMB and then secretory endometrium on endometrial curettage.  On Tamoxifen for breast cancer  reduction.  Elevated glucose.   PLAN  We discussed possible etiologies of the heavy bleeding - shedding of the build up of her endometrial lining, Tamoxifen.  Check CBC and A1C.  Return for pelvic US to check IUD.   An After Visit Summary was printed and given to the patient.  __15____ minutes face to face time of which over 50% was spent in counseling.

## 2019-06-10 ENCOUNTER — Other Ambulatory Visit: Payer: Self-pay

## 2019-06-10 ENCOUNTER — Encounter: Payer: Self-pay | Admitting: Obstetrics and Gynecology

## 2019-06-10 ENCOUNTER — Ambulatory Visit (INDEPENDENT_AMBULATORY_CARE_PROVIDER_SITE_OTHER): Payer: No Typology Code available for payment source | Admitting: Obstetrics and Gynecology

## 2019-06-10 VITALS — BP 118/70 | HR 80 | Temp 96.0°F | Ht 63.25 in | Wt 216.0 lb

## 2019-06-10 DIAGNOSIS — N921 Excessive and frequent menstruation with irregular cycle: Secondary | ICD-10-CM | POA: Diagnosis not present

## 2019-06-10 DIAGNOSIS — Z30431 Encounter for routine checking of intrauterine contraceptive device: Secondary | ICD-10-CM

## 2019-06-10 DIAGNOSIS — R7309 Other abnormal glucose: Secondary | ICD-10-CM | POA: Diagnosis not present

## 2019-06-11 LAB — HEMOGLOBIN A1C
Est. average glucose Bld gHb Est-mCnc: 123 mg/dL
Hgb A1c MFr Bld: 5.9 % — ABNORMAL HIGH (ref 4.8–5.6)

## 2019-06-11 LAB — CBC
Hematocrit: 38.2 % (ref 34.0–46.6)
Hemoglobin: 12 g/dL (ref 11.1–15.9)
MCH: 25.5 pg — ABNORMAL LOW (ref 26.6–33.0)
MCHC: 31.4 g/dL — ABNORMAL LOW (ref 31.5–35.7)
MCV: 81 fL (ref 79–97)
Platelets: 329 10*3/uL (ref 150–450)
RBC: 4.7 x10E6/uL (ref 3.77–5.28)
RDW: 12.7 % (ref 11.7–15.4)
WBC: 7.5 10*3/uL (ref 3.4–10.8)

## 2019-06-14 ENCOUNTER — Encounter: Payer: Self-pay | Admitting: Obstetrics and Gynecology

## 2019-06-16 ENCOUNTER — Telehealth: Payer: Self-pay | Admitting: Obstetrics and Gynecology

## 2019-06-16 NOTE — Telephone Encounter (Signed)
Call placed to patient to review benefit and scheduled recommended utlrasound. Left voicemail message requesting a return call

## 2019-07-06 ENCOUNTER — Other Ambulatory Visit: Payer: Self-pay | Admitting: *Deleted

## 2019-07-06 MED ORDER — TAMOXIFEN CITRATE 10 MG PO TABS: 10.0000 mg | ORAL_TABLET | Freq: Every day | ORAL | 3 refills | Status: DC

## 2019-07-13 NOTE — Telephone Encounter (Signed)
Call placed to patient to review benefit and scheduled recommended utlrasound. Left voicemail message requesting a return call.

## 2019-07-20 ENCOUNTER — Telehealth: Payer: Self-pay | Admitting: Obstetrics and Gynecology

## 2019-07-20 NOTE — Telephone Encounter (Signed)
Please check and see how the patient's vaginal bleeding is doing post Mirena IUD insertion.  She was having heavy bleeding when she was last seen.

## 2019-07-20 NOTE — Telephone Encounter (Signed)
Left message to call Jaesean Litzau, RN at [facility] [phone].   

## 2019-07-20 NOTE — Telephone Encounter (Signed)
Call to patient to schedule recommended ultrasound. Spoke with patient who stated that she did not wish to schedule at this time due to being out of work.  Routing to Dr. Quincy Simmonds. Please advise.

## 2019-07-24 NOTE — Telephone Encounter (Signed)
Spoke with patient. Patient reports bleeding has slowed down, reports only spotting when wiping. Denies any other symptoms. Patient is unsure when she will return to work, declines to schedule PUS at this time. Patient states she will schedule PUS after she has returned to work. Advised patient I will provide update to Dr. Quincy Simmonds, our office will f/u if any additional recommendations. Patient verbalizes understanding.   Routing to Dr. Quincy Simmonds

## 2019-07-24 NOTE — Telephone Encounter (Signed)
Spoke to pt. Pt given updated recommendations per Dr Quincy Simmonds. Pt states only having light pinkish discharge now and no pain. Orders cancelled for PUS. Pt instructed to give a call back to office if having heavy bleeding or pain. Pt agreeable.   Routing to Dr Quincy Simmonds for review and will close encounter.

## 2019-07-24 NOTE — Addendum Note (Signed)
Addended by: Georgia Lopes on: 07/24/2019 04:00 PM   Modules accepted: Orders

## 2019-07-24 NOTE — Telephone Encounter (Signed)
If not having heavy bleeding or pain, she does not need a pelvic ultrasound at this time.  Ok to cancel order.

## 2019-07-27 NOTE — Progress Notes (Signed)
   Patient Care Team: Lucianne Lei, MD as PCP - General (Family Medicine)  DIAGNOSIS:    ICD-10-CM   1. Ductal papillomatosis of right breast  N60.11     CHIEF COMPLIANT: Follow-up of high risk for breast cancer on tamoxifen  INTERVAL HISTORY: Kristy Stafford is a 44 y.o. with above-mentioned history of ductal papillomas of the right breast. She is currently on tamoxifen 10mg  daily. She presents to the clinic today for a toxicity check.  She tells me that she is tolerating tamoxifen extremely well.  She does not have any hot flashes or arthralgias.  ALLERGIES:  has No Known Allergies.  MEDICATIONS:  Current Outpatient Medications  Medication Sig Dispense Refill  . ibuprofen (ADVIL) 800 MG tablet Take 800 mg by mouth every 8 (eight) hours as needed.    . tamoxifen (NOLVADEX) 10 MG tablet Take 1 tablet (10 mg total) by mouth daily. 90 tablet 3   No current facility-administered medications for this visit.    PHYSICAL EXAMINATION: ECOG PERFORMANCE STATUS: 1 - Symptomatic but completely ambulatory  Vitals:   07/28/19 1408  BP: 128/89  Pulse: 80  Resp: 17  Temp: 98 F (36.7 C)  SpO2: 97%    LABORATORY DATA:  I have reviewed the data as listed CMP Latest Ref Rng & Units 03/03/2019 02/26/2019 01/11/2016  Glucose 70 - 99 mg/dL 103(H) 111(H) 98  BUN 6 - 20 mg/dL 14 7 11   Creatinine 0.44 - 1.00 mg/dL 0.70 0.77 0.62  Sodium 135 - 145 mmol/L 138 139 137  Potassium 3.5 - 5.1 mmol/L 3.5 4.4 4.0  Chloride 98 - 111 mmol/L 104 104 105  CO2 22 - 32 mmol/L 18(L) 21 25  Calcium 8.9 - 10.3 mg/dL 9.3 9.2 9.1  Total Protein 6.5 - 8.1 g/dL - - 7.5  Total Bilirubin 0.3 - 1.2 mg/dL - - 0.5  Alkaline Phos 38 - 126 U/L - - 59  AST 15 - 41 U/L - - 16  ALT 14 - 54 U/L - - 16    Lab Results  Component Value Date   WBC 7.5 06/10/2019   HGB 12.0 06/10/2019   HCT 38.2 06/10/2019   MCV 81 06/10/2019   PLT 329 06/10/2019   NEUTROABS 3.7 01/11/2016    ASSESSMENT & PLAN:  Ductal papillomatosis  of right breast 03/13/2019: Multiple lumpectomies: 1:00: Intraductal papilloma, [facility] 3:00: Retroareolar: Intraductal papilloma, [facility] 7:00: Intraductal papilloma, [facility] Additional areolar margin:[facility]  Risk reduction: Tamoxifen 10 mg p.o. daily x5 years started 04/29/2019 Tamoxifen toxicities: Tolerating tamoxifen extremely well. Patient has history of endometrial hyperplasia.  She is following with her gynecologist.  She does have intermittent bleeding.  Breast cancer surveillance: Mammograms to be done in August 2021.  Return to clinic in 1 year for follow-up     No orders of the defined types were placed in this encounter.  The patient has a good understanding of the overall plan. she agrees with it. she will call with any problems that may develop before the next visit here.  Total time spent: 20 mins including face to face time and time spent for planning, charting and coordination of care  Nicholas Lose, MD 07/28/2019  I, Cloyde Reams Dorshimer, am acting as scribe for Dr. Nicholas Lose.  I have reviewed the above documentation for accuracy and completeness, and I agree with the above.

## 2019-07-28 ENCOUNTER — Other Ambulatory Visit: Payer: Self-pay

## 2019-07-28 ENCOUNTER — Inpatient Hospital Stay: Payer: 59 | Attending: Hematology and Oncology | Admitting: Hematology and Oncology

## 2019-07-28 DIAGNOSIS — N6011 Diffuse cystic mastopathy of right breast: Secondary | ICD-10-CM | POA: Insufficient documentation

## 2019-07-28 DIAGNOSIS — Z79811 Long term (current) use of aromatase inhibitors: Secondary | ICD-10-CM | POA: Insufficient documentation

## 2019-07-28 NOTE — Assessment & Plan Note (Signed)
03/13/2019: Multiple lumpectomies: 1:00: Intraductal papilloma, [facility] 3:00: Retroareolar: Intraductal papilloma, [facility] 7:00: Intraductal papilloma, [facility] Additional areolar margin:UD[facility]  Risk reduction: Tamoxifen 10 mg p.o. daily x5 years started 04/29/2019 Tamoxifen toxicities:  Breast cancer surveillance: Mammograms to be done in August 2021.  Return to clinic in 1 year for follow-up

## 2019-07-29 ENCOUNTER — Telehealth: Payer: Self-pay | Admitting: Hematology and Oncology

## 2019-07-29 NOTE — Telephone Encounter (Signed)
I left a message regarding schedule  

## 2019-08-14 ENCOUNTER — Other Ambulatory Visit: Payer: Self-pay

## 2019-08-14 ENCOUNTER — Encounter: Payer: Self-pay | Admitting: Certified Nurse Midwife

## 2019-08-14 ENCOUNTER — Ambulatory Visit: Payer: 59 | Admitting: Certified Nurse Midwife

## 2019-08-14 VITALS — BP 114/70 | HR 70 | Temp 97.3°F | Resp 16 | Wt 206.0 lb

## 2019-08-14 DIAGNOSIS — N390 Urinary tract infection, site not specified: Secondary | ICD-10-CM

## 2019-08-14 DIAGNOSIS — R319 Hematuria, unspecified: Secondary | ICD-10-CM | POA: Diagnosis not present

## 2019-08-14 LAB — POCT URINALYSIS DIPSTICK
Bilirubin, UA: NEGATIVE
Glucose, UA: NEGATIVE
Ketones, UA: NEGATIVE
Nitrite, UA: POSITIVE
Protein, UA: NEGATIVE
Urobilinogen, UA: NEGATIVE E.U./dL — AB
pH, UA: 5 (ref 5.0–8.0)

## 2019-08-14 MED ORDER — NITROFURANTOIN MONOHYD MACRO 100 MG PO CAPS: 100.0000 mg | ORAL_CAPSULE | Freq: Two times a day (BID) | ORAL | 0 refills | Status: DC

## 2019-08-14 MED ORDER — PHENAZOPYRIDINE HCL 100 MG PO TABS: 100.0000 mg | ORAL_TABLET | Freq: Three times a day (TID) | ORAL | 0 refills | Status: DC | PRN

## 2019-08-14 NOTE — Patient Instructions (Addendum)
Urinary Tract Infection, Adult A urinary tract infection (UTI) is an infection of any part of the urinary tract. The urinary tract includes:  The kidneys.  The ureters.  The bladder.  The urethra. These organs make, store, and get rid of pee (urine) in the body. What are the causes? This is caused by germs (bacteria) in your genital area. These germs grow and cause swelling (inflammation) of your urinary tract. What increases the risk? You are more likely to develop this condition if:  You have a small, thin tube (catheter) to drain pee.  You cannot control when you pee or poop (incontinence).  You are female, and: ? You use these methods to prevent pregnancy:  A medicine that kills sperm (spermicide).  A device that blocks sperm (diaphragm). ? You have low levels of a female hormone (estrogen). ? You are pregnant.  You have genes that add to your risk.  You are sexually active.  You take antibiotic medicines.  You have trouble peeing because of: ? A prostate that is bigger than normal, if you are female. ? A blockage in the part of your body that drains pee from the bladder (urethra). ? A kidney stone. ? A nerve condition that affects your bladder (neurogenic bladder). ? Not getting enough to drink. ? Not peeing often enough.  You have other conditions, such as: ? Diabetes. ? A weak disease-fighting system (immune system). ? Sickle cell disease. ? Gout. ? Injury of the spine. What are the signs or symptoms? Symptoms of this condition include:  Needing to pee right away (urgently).  Peeing often.  Peeing small amounts often.  Pain or burning when peeing.  Blood in the pee.  Pee that smells bad or not like normal.  Trouble peeing.  Pee that is cloudy.  Fluid coming from the vagina, if you are female.  Pain in the belly or lower back. Other symptoms include:  Throwing up (vomiting).  No urge to eat.  Feeling mixed up (confused).  Being tired  and grouchy (irritable).  A fever.  Watery poop (diarrhea). How is this treated? This condition may be treated with:  Antibiotic medicine.  Other medicines.  Drinking enough water. Follow these instructions at home:  Medicines  Take over-the-counter and prescription medicines only as told by your doctor.  If you were prescribed an antibiotic medicine, take it as told by your doctor. Do not stop taking it even if you start to feel better. General instructions  Make sure you: ? Pee until your bladder is empty. ? Do not hold pee for a long time. ? Empty your bladder after sex. ? Wipe from front to back after pooping if you are a female. Use each tissue one time when you wipe.  Drink enough fluid to keep your pee pale yellow.  Keep all follow-up visits as told by your doctor. This is important. Contact a doctor if:  You do not get better after 1-2 days.  Your symptoms go away and then come back. Get help right away if:  You have very bad back pain.  You have very bad pain in your lower belly.  You have a fever.  You are sick to your stomach (nauseous).  You are throwing up. Summary  A urinary tract infection (UTI) is an infection of any part of the urinary tract.  This condition is caused by germs in your genital area.  There are many risk factors for a UTI. These include having a small, thin   tube to drain pee and not being able to control when you pee or poop.  Treatment includes antibiotic medicines for germs.  Drink enough fluid to keep your pee pale yellow. This information is not intended to replace advice given to you by your health care provider. Make sure you discuss any questions you have with your health care provider. Document Revised: 05/01/2018 Document Reviewed: 11/21/2017 Elsevier Patient Education  [employer].  Urinary Tract Infection, Adult A urinary tract infection (UTI) is an infection of any part of the urinary tract. The urinary  tract includes:  The kidneys.  The ureters.  The bladder.  The urethra. These organs make, store, and get rid of pee (urine) in the body. What are the causes? This is caused by germs (bacteria) in your genital area. These germs grow and cause swelling (inflammation) of your urinary tract. What increases the risk? You are more likely to develop this condition if:  You have a small, thin tube (catheter) to drain pee.  You cannot control when you pee or poop (incontinence).  You are female, and: ? You use these methods to prevent pregnancy:  A medicine that kills sperm (spermicide).  A device that blocks sperm (diaphragm). ? You have low levels of a female hormone (estrogen). ? You are pregnant.  You have genes that add to your risk.  You are sexually active.  You take antibiotic medicines.  You have trouble peeing because of: ? A prostate that is bigger than normal, if you are female. ? A blockage in the part of your body that drains pee from the bladder (urethra). ? A kidney stone. ? A nerve condition that affects your bladder (neurogenic bladder). ? Not getting enough to drink. ? Not peeing often enough.  You have other conditions, such as: ? Diabetes. ? A weak disease-fighting system (immune system). ? Sickle cell disease. ? Gout. ? Injury of the spine. What are the signs or symptoms? Symptoms of this condition include:  Needing to pee right away (urgently).  Peeing often.  Peeing small amounts often.  Pain or burning when peeing.  Blood in the pee.  Pee that smells bad or not like normal.  Trouble peeing.  Pee that is cloudy.  Fluid coming from the vagina, if you are female.  Pain in the belly or lower back. Other symptoms include:  Throwing up (vomiting).  No urge to eat.  Feeling mixed up (confused).  Being tired and grouchy (irritable).  A fever.  Watery poop (diarrhea). How is this treated? This condition may be treated  with:  Antibiotic medicine.  Other medicines.  Drinking enough water. Follow these instructions at home:  Medicines  Take over-the-counter and prescription medicines only as told by your doctor.  If you were prescribed an antibiotic medicine, take it as told by your doctor. Do not stop taking it even if you start to feel better. General instructions  Make sure you: ? Pee until your bladder is empty. ? Do not hold pee for a long time. ? Empty your bladder after sex. ? Wipe from front to back after pooping if you are a female. Use each tissue one time when you wipe.  Drink enough fluid to keep your pee pale yellow.  Keep all follow-up visits as told by your doctor. This is important. Contact a doctor if:  You do not get better after 1-2 days.  Your symptoms go away and then come back. Get help right away if:  You  have very bad back pain.  You have very bad pain in your lower belly.  You have a fever.  You are sick to your stomach (nauseous).  You are throwing up. Summary  A urinary tract infection (UTI) is an infection of any part of the urinary tract.  This condition is caused by germs in your genital area.  There are many risk factors for a UTI. These include having a small, thin tube to drain pee and not being able to control when you pee or poop.  Treatment includes antibiotic medicines for germs.  Drink enough fluid to keep your pee pale yellow. This information is not intended to replace advice given to you by your health care provider. Make sure you discuss any questions you have with your health care provider. Document Revised: 05/01/2018 Document Reviewed: 11/21/2017 Elsevier Patient Education  2020 [employer].

## 2019-08-14 NOTE — Progress Notes (Signed)
44 y.o. Married [place] American female [phone] here with complaint of UTI, with onset  on 3 days.. Patient complaining of urinary frequency/urgency/ and pain with urination. Patient denies fever, chills, nausea or back pain. No new personal products. Patient feels not to sexual activity. Denies any vaginal symptoms.  Contraception is Mirena IUD. Patient having scant to none bleeding with IUD now. Patient trying to drinking adequate water intake. Patient uses bath balms with tub bath frequently, feels this may have increased occurrence. No other health issues today.  Review of Systems  Constitutional: Negative.   HENT: Negative.   Eyes: Negative.   Respiratory: Negative.   Cardiovascular: Negative.   Gastrointestinal: Negative.   Genitourinary: Positive for dysuria, frequency and urgency.  Musculoskeletal: Negative.   Skin: Negative.   Neurological: Negative.   Endo/Heme/Allergies: Negative.   Psychiatric/Behavioral: Negative.     O: Healthy female WDWN Affect: Normal, orientation x 3 Skin : warm and dry CVAT: negative bilateral Abdomen: positive for suprapubic tenderness  Pelvic exam: External genital area: normal, no lesions Bladder,Urethra tender, Urethral meatus: tender, slightly red Vagina: normal brown tinged vaginal discharge, normal appearance   Cervix: normal, non tender, IUD string noted in cervix Uterus:normal,non tender Adnexa: normal non tender, no fullness or masses Rectal area:no lesions   A: UTI Normal pelvic exam Contraception Mirena IUD  poct urine-rbc 2+, wbc 2+, nitirite+, protein 2+  P: Reviewed findings of UTI and need for treatment. Rx: Macrobid 100 mg bid x 7 see order with instructions Rx Pyridium 100 mg see order with instructions [id number] micro, culture Reviewed warning signs and symptoms of UTI and need to advise if occurring. Discussed avoid bath balms or chemicals in bath tub which can increase risk of UTI. Encouraged to limit soda, tea, and  coffee and be sure to increase water intake. Questions addressed.   RV prn

## 2019-08-15 LAB — URINALYSIS, MICROSCOPIC ONLY
Casts: NONE SEEN /lpf
RBC, Urine: >30 /hpf — AB (ref 0–2)
WBC, UA: >30 /hpf — AB (ref 0–5)

## 2019-08-16 LAB — URINE CULTURE

## 2019-08-17 ENCOUNTER — Telehealth: Payer: Self-pay

## 2019-08-17 NOTE — Telephone Encounter (Signed)
Left message for call back.

## 2019-08-17 NOTE — Telephone Encounter (Signed)
-----   Message from Regina Eck, CNM sent at 08/17/2019  7:48 AM EDT ----- Notify patient her urine culture is complete and Macrobid is effective to treat her urinary infection. Complete all medication. Patient status?

## 2019-08-17 NOTE — Telephone Encounter (Signed)
Patient is returning call to Joy. °

## 2019-08-17 NOTE — Telephone Encounter (Signed)
Patient notified of results. See lab 

## 2019-12-15 ENCOUNTER — Ambulatory Visit (INDEPENDENT_AMBULATORY_CARE_PROVIDER_SITE_OTHER): Payer: No Typology Code available for payment source | Admitting: Obstetrics and Gynecology

## 2019-12-15 ENCOUNTER — Other Ambulatory Visit: Payer: Self-pay

## 2019-12-15 ENCOUNTER — Encounter: Payer: Self-pay | Admitting: Obstetrics and Gynecology

## 2019-12-15 VITALS — BP 116/70 | HR 68 | Resp 12 | Ht 64.0 in | Wt 200.0 lb

## 2019-12-15 DIAGNOSIS — Z01419 Encounter for gynecological examination (general) (routine) without abnormal findings: Secondary | ICD-10-CM

## 2019-12-15 DIAGNOSIS — Z113 Encounter for screening for infections with a predominantly sexual mode of transmission: Secondary | ICD-10-CM | POA: Diagnosis not present

## 2019-12-15 DIAGNOSIS — T8332XA Displacement of intrauterine contraceptive device, initial encounter: Secondary | ICD-10-CM

## 2019-12-15 DIAGNOSIS — Z23 Encounter for immunization: Secondary | ICD-10-CM | POA: Diagnosis not present

## 2019-12-15 DIAGNOSIS — R7989 Other specified abnormal findings of blood chemistry: Secondary | ICD-10-CM

## 2019-12-15 DIAGNOSIS — Z3141 Encounter for fertility testing: Secondary | ICD-10-CM | POA: Diagnosis not present

## 2019-12-15 NOTE — Patient Instructions (Signed)

## 2019-12-15 NOTE — Progress Notes (Signed)
44 y.o. [id number] Divorced [place] American female here for annual exam.    No vaginal bleeding since May, 2021.  The first couple of months of her Mirena IUD were associated with heavy bleeding.   Taking Tamoxifen for breast cancer prevention.   She has some desire for pregnancy.   Dealing with hoarseness and sore throat.  Saw her PCP.  Negative Covid testing.  She is waiting for strep test result.   PCP: Lucianne Lei, MD    Patient's last menstrual period was 10/19/2019 (approximate).           Sexually active: Yes.    The current method of family planning is Mirena inserted 05/06/19.    Exercising: Yes.    walking and dancing Smoker:  Former  Health Maintenance: Pap:   12/11/18 Neg:Neg HR HPV  06/23/12 Neg:Neg HR HPV History of abnormal Pap:  no MMG:  03/13/19 -- See Epic for details -- right breast lumpectomy Colonoscopy:  n/a BMD:   n/a  Result  n/a TDaP:  12/11/18 Gardasil: patient had 2 vaccines HIV and Hep C: 12/11/18 Negative Screening Labs:  PCP   reports that she has quit smoking. She has never used smokeless tobacco. She reports previous alcohol use. She reports that she does not use drugs.  Past Medical History:  Diagnosis Date  . Anemia   . Breast mass, left 10/10/2012   Excised 11/24/12, papilloma on path   . 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  Slides reread by [facility] and determined to be simple endometrial hyperplasia.  . Depression   . 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 Elevated hemoglobin A1c 2021  . Family history of breast cancer   . Family history of ovarian cancer   . Family history of pancreatic cancer   . Family history of prostate cancer   . High anti-Mullerian hormone 2020  . Hyperlipidemia   . Medical history non-contributory   . Nipple discharge    left breast  . PCOS (polycystic ovarian syndrome)   . Sleep apnea    has a cpap for 3 yr-told she will need to bring day of surgery and  use post op    Past Surgical History:  Procedure Laterality Date  . BREAST DUCTAL SYSTEM EXCISION Left 11/24/2012   Procedure: EXCISION DUCTAL SYSTEM BREAST;  Surgeon: Haywood Lasso, MD;  Location: [facility];  Service: General;  Laterality: Left;  . BREAST EXCISIONAL BIOPSY Left 2014  . BREAST LUMPECTOMY WITH NEEDLE LOCALIZATION Right 03/13/2019   Procedure: RIGHT BREAST LUMPECTOMY WITH NEEDLE LOCALIZATION X2;  Surgeon: Fanny Skates, MD;  Location: [place];  Service: General;  Laterality: Right;  . BREAST LUMPECTOMY WITH RADIOACTIVE SEED LOCALIZATION Right 03/13/2019   Procedure: RIGHT  BREAST LUMPECTOMY WITH RADIOACTIVE SEED LOCALIZATION;  Surgeon: Fanny Skates, MD;  Location: [place];  Service: General;  Laterality: Right;  . CARPAL TUNNEL RELEASE Right 04/20/2019   Procedure: RIGHT CARPAL TUNNEL RELEASE;  Surgeon: Leanora Cover, MD;  Location: [facility];  Service: Orthopedics;  Laterality: Right;  . CARPAL TUNNEL RELEASE Left 05/15/2019   Procedure: LEFT CARPAL TUNNEL RELEASE;  Surgeon: Leanora Cover, MD;  Location: [facility];  Service: Orthopedics;  Laterality: Left;  . DILATATION & CURETTAGE/HYSTEROSCOPY WITH MYOSURE N/A 03/03/2019   Procedure: DILATATION & CURETTAGE/HYSTEROSCOPY;  Surgeon: Nunzio Cobbs, MD;  Location: [facility];  Service: Gynecology;  Laterality: N/A;  .  NO PAST SURGERIES      Current Outpatient Medications  Medication Sig Dispense Refill  . ibuprofen (ADVIL) 800 MG tablet Take 800 mg by mouth every 8 (eight) hours as needed.    . tamoxifen (NOLVADEX) 10 MG tablet Take 1 tablet (10 mg total) by mouth daily. 90 tablet 3  . VIRTUSSIN A/C 100-10 MG/5ML syrup Take by mouth.     No current facility-administered medications for this visit.    Family History  Problem Relation Age of Onset  . Cancer Mother 106       breast  . Breast cancer Mother    . Diabetes Father   . Cancer Maternal Grandmother        breast--dec age 93  . Breast cancer Maternal Grandmother   . Cancer Maternal Aunt        unsure type, possibly pancreatic  . Ovarian cancer Paternal Grandmother 33  . Prostate cancer Paternal Uncle 62    Review of Systems  Constitutional: Negative.   HENT: Negative.   Eyes: Negative.   Respiratory: Negative.   Cardiovascular: Negative.   Gastrointestinal: Negative.   Endocrine: Negative.   Genitourinary: Negative.   Musculoskeletal: Negative.   Skin: Negative.   Allergic/Immunologic: Negative.   Neurological: Negative.   Hematological: Negative.   Psychiatric/Behavioral: Negative.     Exam:   BP 116/70 (BP Location: Left Arm, Patient Position: Sitting, Cuff Size: Normal)   Pulse 68   Resp 12   Ht 5\' 4"  (1.626 m)   Wt 200 lb (90.7 kg)   LMP 10/19/2019 (Approximate)   BMI 34.33 kg/m     General appearance: alert, cooperative and appears stated age Head: normocephalic, without obvious abnormality, atraumatic Neck: no adenopathy, supple, symmetrical, trachea midline and thyroid normal to inspection and palpation Lungs: clear to auscultation bilaterally Breasts: normal appearance, no masses or tenderness, No nipple retraction or dimpling, No nipple discharge or bleeding, No axillary adenopathy Heart: regular rate and rhythm Abdomen: soft, non-tender; no masses, no organomegaly Extremities: extremities normal, atraumatic, no cyanosis or edema Skin: skin color, texture, turgor normal. No rashes or lesions Lymph nodes: cervical, supraclavicular, and axillary nodes normal. Neurologic: grossly normal  Pelvic: External genitalia:  no lesions              No abnormal inguinal nodes palpated.              Urethra:  normal appearing urethra with no masses, tenderness or lesions              Bartholins and Skenes: normal                 Vagina: normal appearing vagina with normal color and discharge, no lesions               Cervix: no lesions.  IUD tip expelling through the cervix.  Removed with ring forceps after given verbal permission.  IUD intact, shown to patient, and discarded.               Pap taken: No. Bimanual Exam:  Uterus:  normal size, contour, position, consistency, mobility, non-tender              Adnexa: no mass, fullness, tenderness              Rectal exam: Yes.  .  Confirms.              Anus:  normal sphincter tone, no lesions  Chaperone was present for exam:  Terence Lux.   Assessment:   Well woman visit with gynecology exam. Mirena IUD expelling and removed today. Menorrhagia with irregular menses.  Hx oligomenorrhea.  Noted to have hyperplasia with office EMB and then secretory endometrium on endometrial curettage.  On Tamoxifen for breast cancer risk reduction.  Negative genetic testing.   Has variants of undetermined significance.  See oncology yearly.  Elevated glucose.  Fertility testing desired.   Plan: Breast cancer screening to be determined.  Office will contact the [facility] to determine if she needs routine screening or dx mammogram.  Self breast awareness reviewed. Pap and HR HPV as above. Guidelines for Calcium, Vitamin D, regular exercise program including cardiovascular and weight bearing exercise. Routine labs including A1C, and AMH.  STD screening.  IUD removed today.  We discussed potential replacement of Mirena IUD versus other progesterone therapy if fertility is not pursued.   Follow up annually and prn.    After visit summary provided.

## 2019-12-17 ENCOUNTER — Encounter: Payer: Self-pay | Admitting: Obstetrics and Gynecology

## 2019-12-17 ENCOUNTER — Telehealth: Payer: Self-pay | Admitting: Obstetrics and Gynecology

## 2019-12-17 LAB — CHLAMYDIA/GONOCOCCUS/TRICHOMONAS, NAA
Chlamydia by NAA: NEGATIVE
Gonococcus by NAA: NEGATIVE
Trich vag by NAA: NEGATIVE

## 2019-12-17 NOTE — Telephone Encounter (Signed)
Please contact the [facility] to schedule mammogram for the patient.   She had several breast procedures last year, and I would like for them to confirm if she needs a screening or diagnostic mammogram.

## 2019-12-18 LAB — HEMOGLOBIN A1C
Est. average glucose Bld gHb Est-mCnc: 128 mg/dL
Hgb A1c MFr Bld: 6.1 % — ABNORMAL HIGH (ref 4.8–5.6)

## 2019-12-18 LAB — CBC
Hematocrit: 39.8 % (ref 34.0–46.6)
Hemoglobin: 12.4 g/dL (ref 11.1–15.9)
MCH: 24.9 pg — ABNORMAL LOW (ref 26.6–33.0)
MCHC: 31.2 g/dL — ABNORMAL LOW (ref 31.5–35.7)
MCV: 80 fL (ref 79–97)
Platelets: 275 10*3/uL (ref 150–450)
RBC: 4.97 x10E6/uL (ref 3.77–5.28)
RDW: 13.3 % (ref 11.7–15.4)
WBC: 9.8 10*3/uL (ref 3.4–10.8)

## 2019-12-18 LAB — COMPREHENSIVE METABOLIC PANEL
ALT: 17 IU/L (ref 0–32)
AST: 12 IU/L (ref 0–40)
Albumin/Globulin Ratio: 1.4 (ref 1.2–2.2)
Albumin: 4.2 g/dL (ref 3.8–4.8)
Alkaline Phosphatase: 87 IU/L (ref 48–121)
BUN/Creatinine Ratio: 12 (ref 9–23)
BUN: 9 mg/dL (ref 6–24)
Bilirubin Total: 0.2 mg/dL (ref 0.0–1.2)
CO2: 23 mmol/L (ref 20–29)
Calcium: 9.7 mg/dL (ref 8.7–10.2)
Chloride: 103 mmol/L (ref 96–106)
Creatinine, Ser: 0.76 mg/dL (ref 0.57–1.00)
GFR calc Af Amer: 113 mL/min/{1.73_m2} (ref >59)
GFR calc non Af Amer: 98 mL/min/{1.73_m2} (ref >59)
Globulin, Total: 3.1 g/dL (ref 1.5–4.5)
Glucose: 93 mg/dL (ref 65–99)
Potassium: 4.1 mmol/L (ref 3.5–5.2)
Sodium: 138 mmol/L (ref 134–144)
Total Protein: 7.3 g/dL (ref 6.0–8.5)

## 2019-12-18 LAB — LIPID PANEL
Chol/HDL Ratio: 5.7 ratio — ABNORMAL HIGH (ref 0.0–4.4)
Cholesterol, Total: 226 mg/dL — ABNORMAL HIGH (ref 100–199)
HDL: 40 mg/dL (ref >39)
LDL Chol Calc (NIH): 171 mg/dL — ABNORMAL HIGH (ref 0–99)
Triglycerides: 85 mg/dL (ref 0–149)
VLDL Cholesterol Cal: 15 mg/dL (ref 5–40)

## 2019-12-18 LAB — HEPATITIS C ANTIBODY: Hep C Virus Ab: <0.1 s/co ratio (ref 0.0–0.9)

## 2019-12-18 LAB — HEP, RPR, HIV PANEL
HIV Screen 4th Generation wRfx: NONREACTIVE
Hepatitis B Surface Ag: NEGATIVE
RPR Ser Ql: NONREACTIVE

## 2019-12-18 LAB — TSH: TSH: 1.95 u[IU]/mL (ref 0.450–4.500)

## 2019-12-18 LAB — ANTI MULLERIAN HORMONE: ANTI-MULLERIAN HORMONE (AMH): 14.6 ng/mL — ABNORMAL HIGH

## 2019-12-18 LAB — VITAMIN D 25 HYDROXY (VIT D DEFICIENCY, FRACTURES): Vit D, 25-Hydroxy: 17.9 ng/mL — ABNORMAL LOW (ref 30.0–100.0)

## 2019-12-18 NOTE — Telephone Encounter (Signed)
Spoke with Anderson [facility] at [facility], was advised patient has current orders for bilateral Dx MMG, ordered by Dr. Lindi Adie, due 12/2019. Will Cc Dr. Quincy Simmonds in report.   Patient scheduled for bilateral Dx MMG on 01/04/20 at 3:50pm, arrive at 3:30pm.   Spoke with patient, advised as seen above. Patient verbalizes understanding and is agreeable.   Placed in [place] hold.   Routing to provider for final review. Patient is agreeable to disposition. Will close encounter.

## 2019-12-19 NOTE — Addendum Note (Signed)
Addended by: Yisroel Ramming, Dietrich Pates E on: 12/19/2019 07:17 AM   Modules accepted: Orders

## 2019-12-22 ENCOUNTER — Telehealth: Payer: Self-pay

## 2019-12-22 DIAGNOSIS — N921 Excessive and frequent menstruation with irregular cycle: Secondary | ICD-10-CM

## 2019-12-22 MED ORDER — VITAMIN D (ERGOCALCIFEROL) 1.25 MG (50000 UNIT) PO CAPS: 50000.0000 [IU] | ORAL_CAPSULE | ORAL | 0 refills | Status: DC

## 2019-12-22 NOTE — Telephone Encounter (Signed)
Spoke with pt. Pt given results and recommendations per Dr Quincy Simmonds. Pt verbalized understanding.  Rx for Vit D sent to pharmacy on file. # 12, 0RF. Pt has follow up lab appt on 03/24/20 at 845 am.  Pt states will call PCP to follow up for elevated cholesterol and A1C.   Pt declines pursing fertility at this time. Pt desires another Mirena IUD placement. Pt scheduled for Mirena IUD on 12/29/19 at 0900 am with Dr Quincy Simmonds. Pt agreeable and verbalized understanding. Pt advised to take Motrin 800 mg with food and water one hour before procedure. Pt agreeable. Pt aware of call for benefits from business office.   Routing to Dr Quincy Simmonds for review IUD orders placed Cc: Alfonse Spruce for precert  Encounter closed.

## 2019-12-22 NOTE — Telephone Encounter (Signed)
-----   Message from Nunzio Cobbs, MD sent at 12/19/2019  7:17 AM EDT ----- Please contact patient with results of testing.   Her testing for infectious disease is all negative for HIV, syphilis, hepatitis B and C, trichomonas, gonorrhea, and chlamydia.   Her hemoglobin A1C and her cholesterol are elevated.  Her A1C indicates she is prediabetic. I recommend she see her PCP in follow up to determine if it is time for treatment of these conditions.  Her blood counts and thyroid are normal.   Her vitamin D level is low, and I recommend she start vitamin D 50,000 IU weekly for 3 months.  Please schedule a lab recheck for 3 months from now. I will place a future order.   We did fertility testing, and her antimullerian hormone level is elevated due to her PCOS.  Her PCOS may represent a challenge if she pursues fertility, because she would need to take medication to prompt her ovaries to release the eggs.  If she wishes to pursue this, I recommend she contact her oncologist, first. (She takes Tamoxifen for reducing breast cancer risk.) She would then need to see a fertility specialist because of the PCOS and her age.  I do recommend a form of progesterone to control her hyperplasia.  Trying another Mirena IUD is a way to limit her hormonal exposure, which would have a positive influence on her breast health.

## 2019-12-23 ENCOUNTER — Telehealth: Payer: Self-pay | Admitting: Obstetrics and Gynecology

## 2019-12-23 NOTE — Telephone Encounter (Signed)
Call to patient. Per DPR, OK to leave message on voicemail.   Left voicemail requesting a return call to [facility] to review benefits for scheduled IUD insertion with Brook A. Quincy Simmonds, MD, Cherlynn June

## 2019-12-23 NOTE — Telephone Encounter (Signed)
Spoke with patient regarding benefits for scheduled IUD insertion. Patient acknowledges understanding of information presented. Encounter closed.

## 2019-12-25 ENCOUNTER — Telehealth: Payer: Self-pay

## 2019-12-25 NOTE — Telephone Encounter (Signed)
Spoke with pt. Pt cancelled IUD insertion due to work schedule. Pt rescheduled to first available appt with Dr Quincy Simmonds, now scheduled for 9/15 at 4pm. Pt agreeable and verbalized understanding. Pt asked to be placed on cancellation list, spoke with Greta and pt placed on cancellation list after 01/17/20. Pt verbalized understanding.   Encounter closed.

## 2019-12-25 NOTE — Telephone Encounter (Signed)
Patient cancelled IUD procedure due to work. Patient would like to reschedule.

## 2019-12-29 ENCOUNTER — Ambulatory Visit: Payer: Self-pay | Admitting: Obstetrics and Gynecology

## 2020-01-04 ENCOUNTER — Other Ambulatory Visit: Payer: Self-pay | Admitting: Hematology and Oncology

## 2020-01-04 ENCOUNTER — Ambulatory Visit
Admission: RE | Admit: 2020-01-04 | Discharge: 2020-01-04 | Disposition: A | Discharge status: Home / Self Care | Payer: No Typology Code available for payment source | Source: Ambulatory Visit | Attending: Hematology and Oncology | Admitting: Hematology and Oncology

## 2020-01-04 ENCOUNTER — Other Ambulatory Visit: Payer: Self-pay

## 2020-01-04 DIAGNOSIS — Z1231 Encounter for screening mammogram for malignant neoplasm of breast: Secondary | ICD-10-CM

## 2020-01-04 DIAGNOSIS — N6011 Diffuse cystic mastopathy of right breast: Secondary | ICD-10-CM

## 2020-01-26 ENCOUNTER — Telehealth: Payer: Self-pay | Admitting: *Deleted

## 2020-01-26 NOTE — Telephone Encounter (Signed)
Encounter reviewed and closed.   Ok to remove from mammogram hold.

## 2020-01-26 NOTE — Telephone Encounter (Signed)
Patient is in MMG hold for annual screening MMG.  Hx of Ductal papillomatosis of right breast.  Screening completed on 01/04/20 at [facility]: No mammographic evidence of malignancy. A result letter of this screening mammogram will be mailed directly to the patient.  RECOMMENDATION: Screening mammogram in one year. (Code:[id number])  BI-RADS CATEGORY  1: Negative.  Removed from MMG hold.   Routing to Dr. Quincy Simmonds for final review.

## 2020-02-03 ENCOUNTER — Telehealth: Payer: Self-pay | Admitting: Obstetrics and Gynecology

## 2020-02-03 NOTE — Telephone Encounter (Signed)
Patient called to cancel appointment for Mirena insertion. Patient did not want to reschedule at this time. Patient states she will call back when she is ready to schedule.

## 2020-02-04 NOTE — Telephone Encounter (Signed)
Triage, Please reach out to patient to understand her throught process regarding no Mirena IUD.  She does have risk of endometrial hyperplasia due to PCOS and she is on Tamoxifen.

## 2020-02-05 NOTE — Telephone Encounter (Signed)
Left message to call Harvin Konicek, RN at [facility] [phone].   

## 2020-02-09 NOTE — Telephone Encounter (Signed)
Left message to call Amandine Covino, RN at [facility] [phone].   

## 2020-02-10 ENCOUNTER — Ambulatory Visit: Payer: Self-pay | Admitting: Obstetrics and Gynecology

## 2020-02-10 NOTE — Telephone Encounter (Signed)
MyChart message to patient.  

## 2020-02-12 NOTE — Telephone Encounter (Signed)
No return call from patient.  MyChart message not read.   Routing to Dr. Quincy Simmonds to advise.

## 2020-02-16 NOTE — Telephone Encounter (Signed)
Encounter reviewed and closed.  

## 2020-03-24 ENCOUNTER — Telehealth: Payer: Self-pay

## 2020-03-24 ENCOUNTER — Other Ambulatory Visit: Payer: Self-pay

## 2020-03-24 ENCOUNTER — Other Ambulatory Visit (INDEPENDENT_AMBULATORY_CARE_PROVIDER_SITE_OTHER): Payer: No Typology Code available for payment source

## 2020-03-24 DIAGNOSIS — R7989 Other specified abnormal findings of blood chemistry: Secondary | ICD-10-CM

## 2020-03-24 NOTE — Telephone Encounter (Signed)
Attempted to return call to pt. Pt VM box full, no answer and no message left. Will send Mychart message.

## 2020-03-24 NOTE — Telephone Encounter (Signed)
Patient would like to reschedule mirena iud insertion.

## 2020-03-25 LAB — VITAMIN D 25 HYDROXY (VIT D DEFICIENCY, FRACTURES): Vit D, 25-Hydroxy: 21.3 ng/mL — ABNORMAL LOW (ref 30.0–100.0)

## 2020-03-25 NOTE — Telephone Encounter (Signed)
Spoke with pt. Pt needing to reschedule Mirena IUD insertion.Pt had cancelled on 02/10/20. Pt is starting LMP today.  Pt scheduled for 05/13/20 at 10 am per pt's request of date and time of appt due to work schedule. Pt agreeable to date and time of appt. Pt advised to take Motrin 800 mg with food and water one hour before procedure. Pt states having irregular cycles and is aware ok to keep OV as long as no heavy bleeding or clots the day of procedure. Pt verbalized understanding.  Cc: Hay[facility] for update, pt aware of PR/benefits  Encounter closed

## 2020-03-30 ENCOUNTER — Other Ambulatory Visit: Payer: Self-pay | Admitting: Obstetrics and Gynecology

## 2020-03-30 DIAGNOSIS — R7989 Other specified abnormal findings of blood chemistry: Secondary | ICD-10-CM

## 2020-03-30 NOTE — Telephone Encounter (Signed)
Medication refill request: Vit D [id number]  Last AEX:  12/15/19  Next OV: 05/13/20  Last MMG (if hormonal medication request): NA Refill authorized: 12/0

## 2020-05-04 IMAGING — MR MR BILATERAL BREAST WITHOUT AND WITH CONTRAST
7 of 12 series · 27 of 48 positions shown · IV contrast (gadavist)
Comparison: Previous exam(s).

CLINICAL DATA: The patient was recently evaluated for left breast
pain. She has history of surgical excision of a papilloma in the
left breast. She has family history of breast cancer in her mother
diagnosed at 55 in her maternal grandmother diagnosed at 90. A
recent biopsy in the upper inner right retroareolar region
demonstrated an intraductal papilloma. Biopsy at 7 o'clock in the
right breast demonstrated dilated ducts which was considered
discordant and is to be excised.

LABS:  None provided
EXAM:
BILATERAL BREAST MRI WITH AND WITHOUT CONTRAST
TECHNIQUE: Multiplanar, multisequence MR images of both breasts were obtained
prior to and following the intravenous administration of 10 ml of
Gadavist

[Series 4: t2_tirm_tra ipat (a-p) · axial · 3.0mm · 0.70mm/px · 1 of 66 slices shown]
[im 1/66]
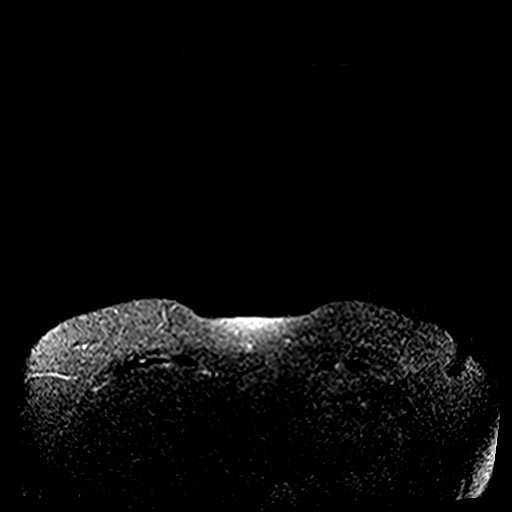

[Series 5: fl3d pre-cm no · axial · non-contrast · 1.2mm · 0.94mm/px · z∈[-79,+112]mm · 4 of 160 slices shown]
[im 1/160]
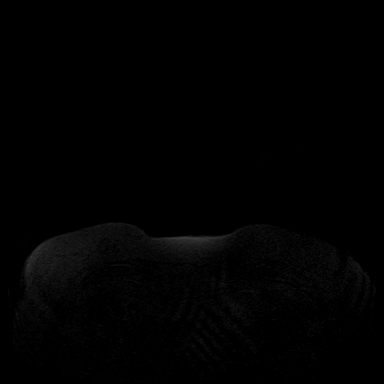
[im 54/160]
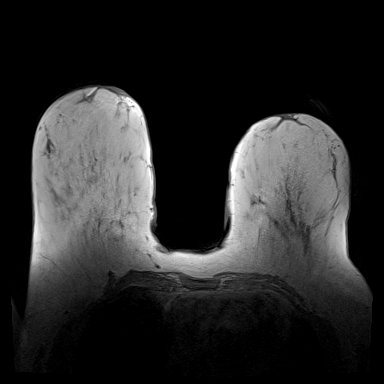
[im 107/160]
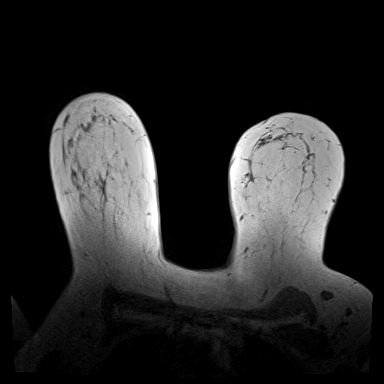
[im 160/160]
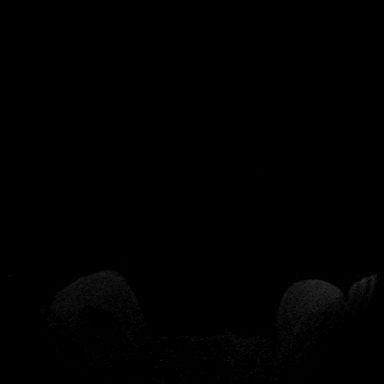

[Series 9: fl3d pre-cm · axial · non-contrast · 1.2mm · 0.87mm/px · z∈[-112,+137]mm · 5 of 208 slices shown]
[im 1/208]
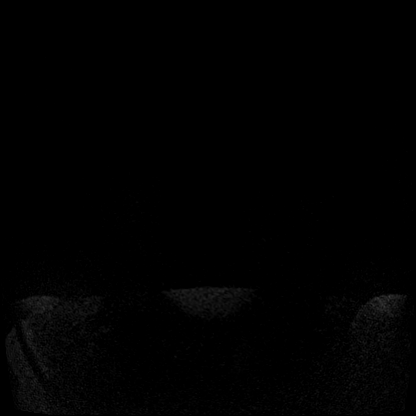
[im 52/208]
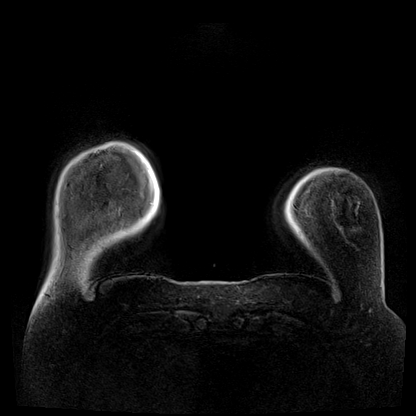
[im 104/208]
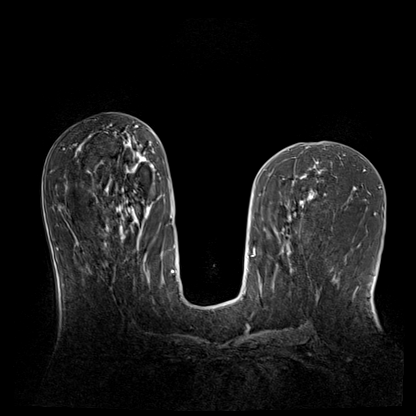
[im 156/208]
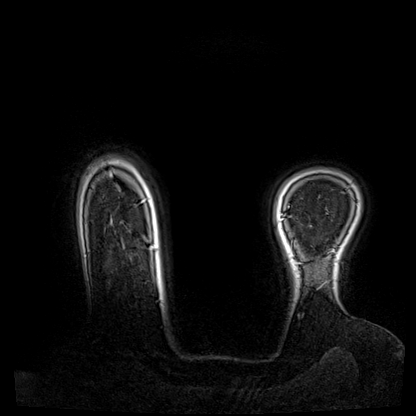
[im 208/208]
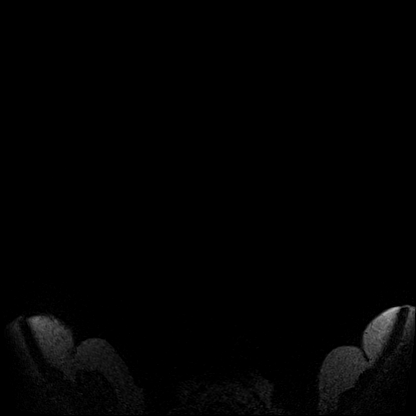

[Series 10: fl3d post-cm 20 · axial · 1.2mm · 0.87mm/px · z∈[-112,+137]mm · 5 of 208 slices shown (1 of 3)]
[im 1/208]
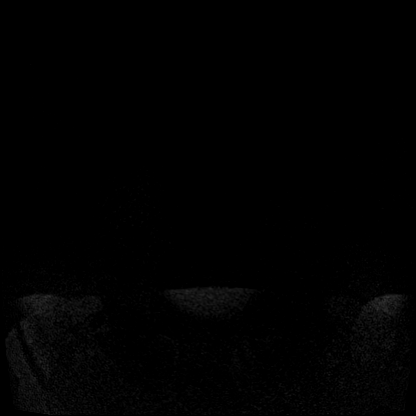
[im 52/208]
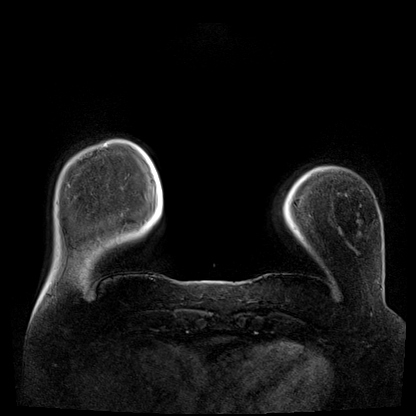
[im 104/208]
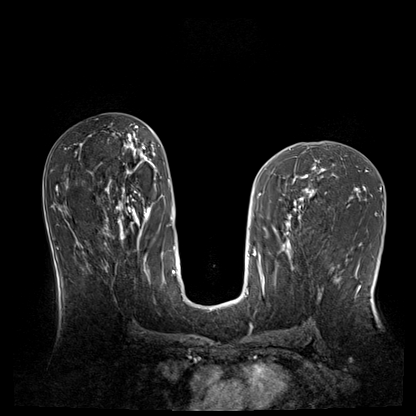
[im 156/208]
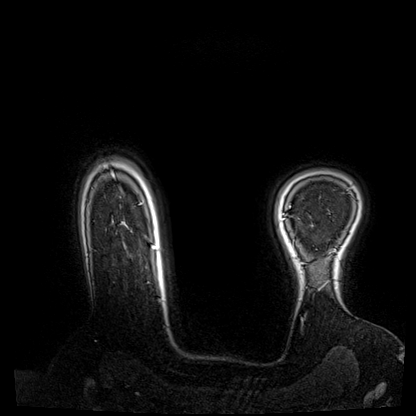
[im 208/208]
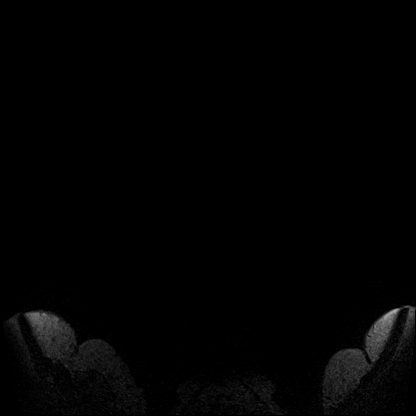

[Series 11: fl3d post-cm 20 · axial · 1.2mm · 0.87mm/px · z∈[-112,+137]mm · 6 of 204 slices shown (2 of 3)]
[im 1/204]
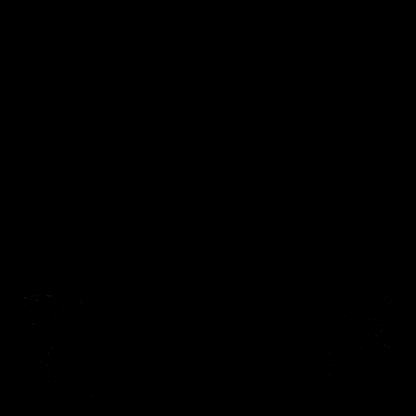
[im 41/204]
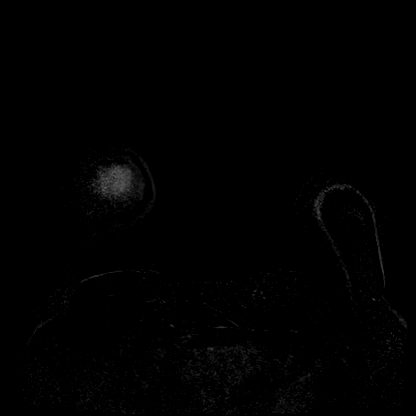
[im 82/204]
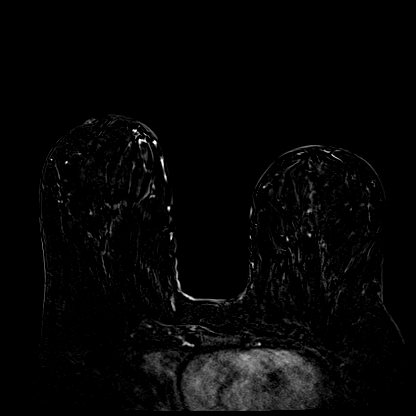
[im 122/204]
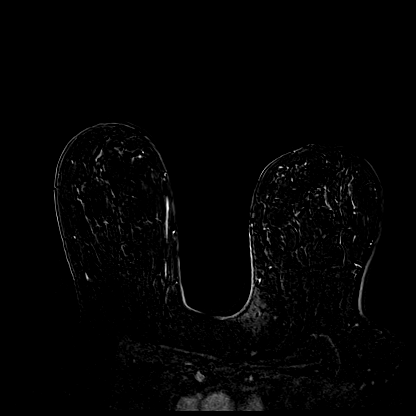
[im 163/204]
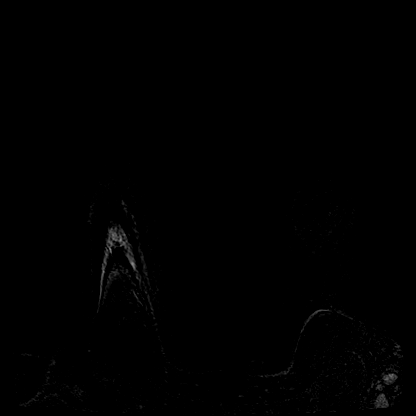
[im 204/204]
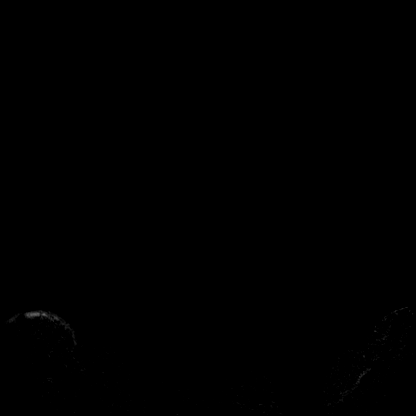

[Series 12: fl3d post-cm 20 · axial · 249.6mm · 0.87mm/px · 1 of 1 slices shown (3 of 3)]
[im 1/1]
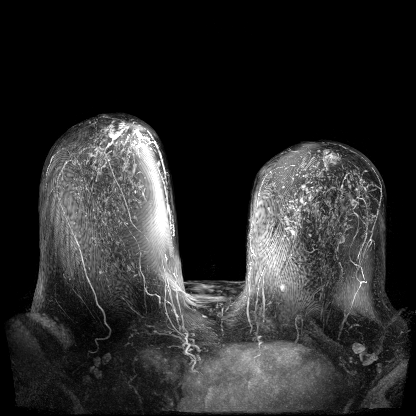

[Series 13: fl3d post-cm 3min · axial · 1.2mm · 0.87mm/px · z∈[-112,+86]mm · 5 of 208 slices shown]
[im 1/208]
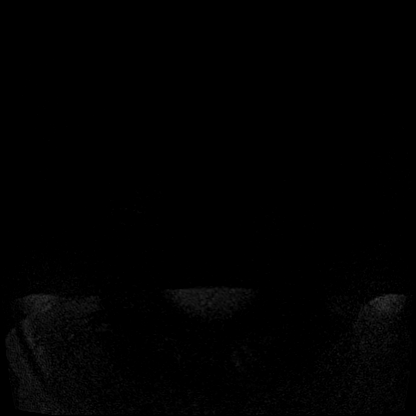
[im 42/208]
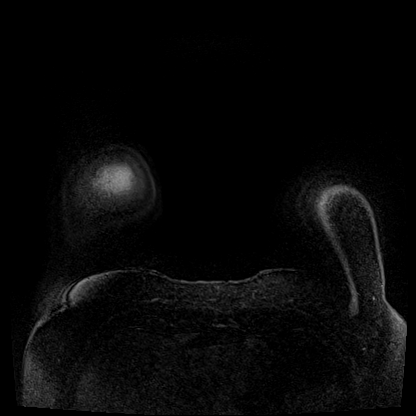
[im 83/208]
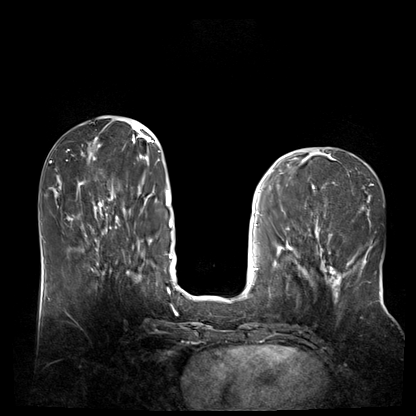
[im 125/208]
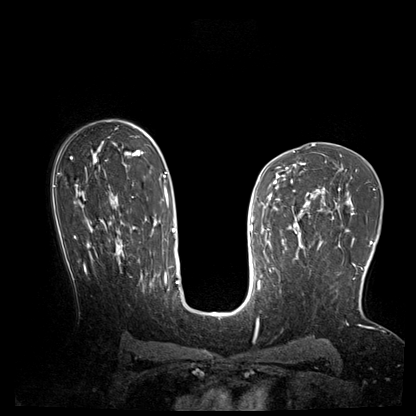
[im 166/208]
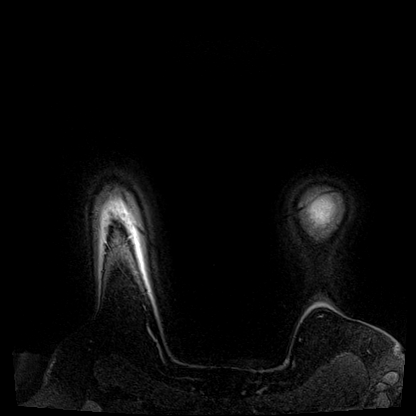

[27 of 48 positions shown; findings below may reference images not displayed]

Three-dimensional MR images were rendered by post-processing of the
original MR data on an independent workstation. The
three-dimensional MR images were interpreted, and findings are
reported in the following complete MRI report for this study. Three
dimensional images were evaluated at the independent DynaC[employer]
workstation
FINDINGS: Breast composition: Scattered

Background parenchymal enhancement: Marked

Right breast: Evaluation is limited due to marked background
enhancement. There is no abnormal enhancement in the region of the
right retroareolar papilloma identified at recent biopsy. The biopsy
clip is seen on series 5, image 100. Two masses are seen in the
retroareolar region, best seen on DynaCAD imaging. These 2 masses
are seen at approximately 4 and 5 o'clock in the periareolar region.
Both of these masses measure approximately 5 mm and demonstrate
persistent enhancement kinetics. There is no abnormal enhancement in
the region of the patient's upper inner left breast biopsy. Just
posterior and inferior to this biopsy clip is linear enhancement
spanning 2.4 cm. This enhancement abuts the biopsy site. There is
high T2 signal in this region and I suspect this enhancement is
along the biopsy tract. This

Left breast: There is clumped non mass enhancement in the upper
inner left breast spanning up to 3.4 cm in AP dimension. This non
mass enhancement demonstrates persistent kinetics.

Lymph nodes: No abnormal appearing lymph nodes.

Ancillary findings:  None.
IMPRESSION: 1. There are two 5 mm masses located in the right subareolar region
at 4 o'clock and 5 o'clock. These masses are indeterminate but [DATE] additional papillomas.
2. There is clumped non mass enhancement in the superomedial left
breast spanning 3.4 cm in AP dimension which is indeterminate.
3. No abnormal enhancement at the site of the known biopsied
papilloma on the right.
4. There is linear enhancement abutting the biopsy clip in the upper
inner left breast thought to represent the biopsy tract.

RECOMMENDATION:
Recommend second-look ultrasound behind the right nipple in an
attempt to identify the 2 5 mm masses. If these masses are not seen
on ultrasound, recommend attempted MRI guided biopsy. MRI guided
biopsy may be difficult due to their location.

Recommend MRI guided biopsy of the clumped non mass enhancement in
the upper inner left breast. The second-look ultrasound should be
performed prior to the MRI guided biopsy.

BI-RADS CATEGORY  4: Suspicious.

## 2020-05-13 ENCOUNTER — Ambulatory Visit: Payer: Self-pay | Admitting: Obstetrics and Gynecology

## 2020-07-02 ENCOUNTER — Other Ambulatory Visit: Payer: Self-pay | Admitting: Obstetrics and Gynecology

## 2020-07-02 DIAGNOSIS — R7989 Other specified abnormal findings of blood chemistry: Secondary | ICD-10-CM

## 2020-07-02 IMAGING — DX MM BREAST SURGICAL SPECIMEN
1 series · 2 of 2 positions shown · non-contrast
Comparison: Previous exam(s).

CLINICAL DATA: Status post wire localized excision of the RIGHT
breast.

EXAM:
SPECIMEN RADIOGRAPH OF THE RIGHT BREAST

[Series 2: specimen digital x-ray, derived · right · 2 of 2 slices shown]
[im 1/2]
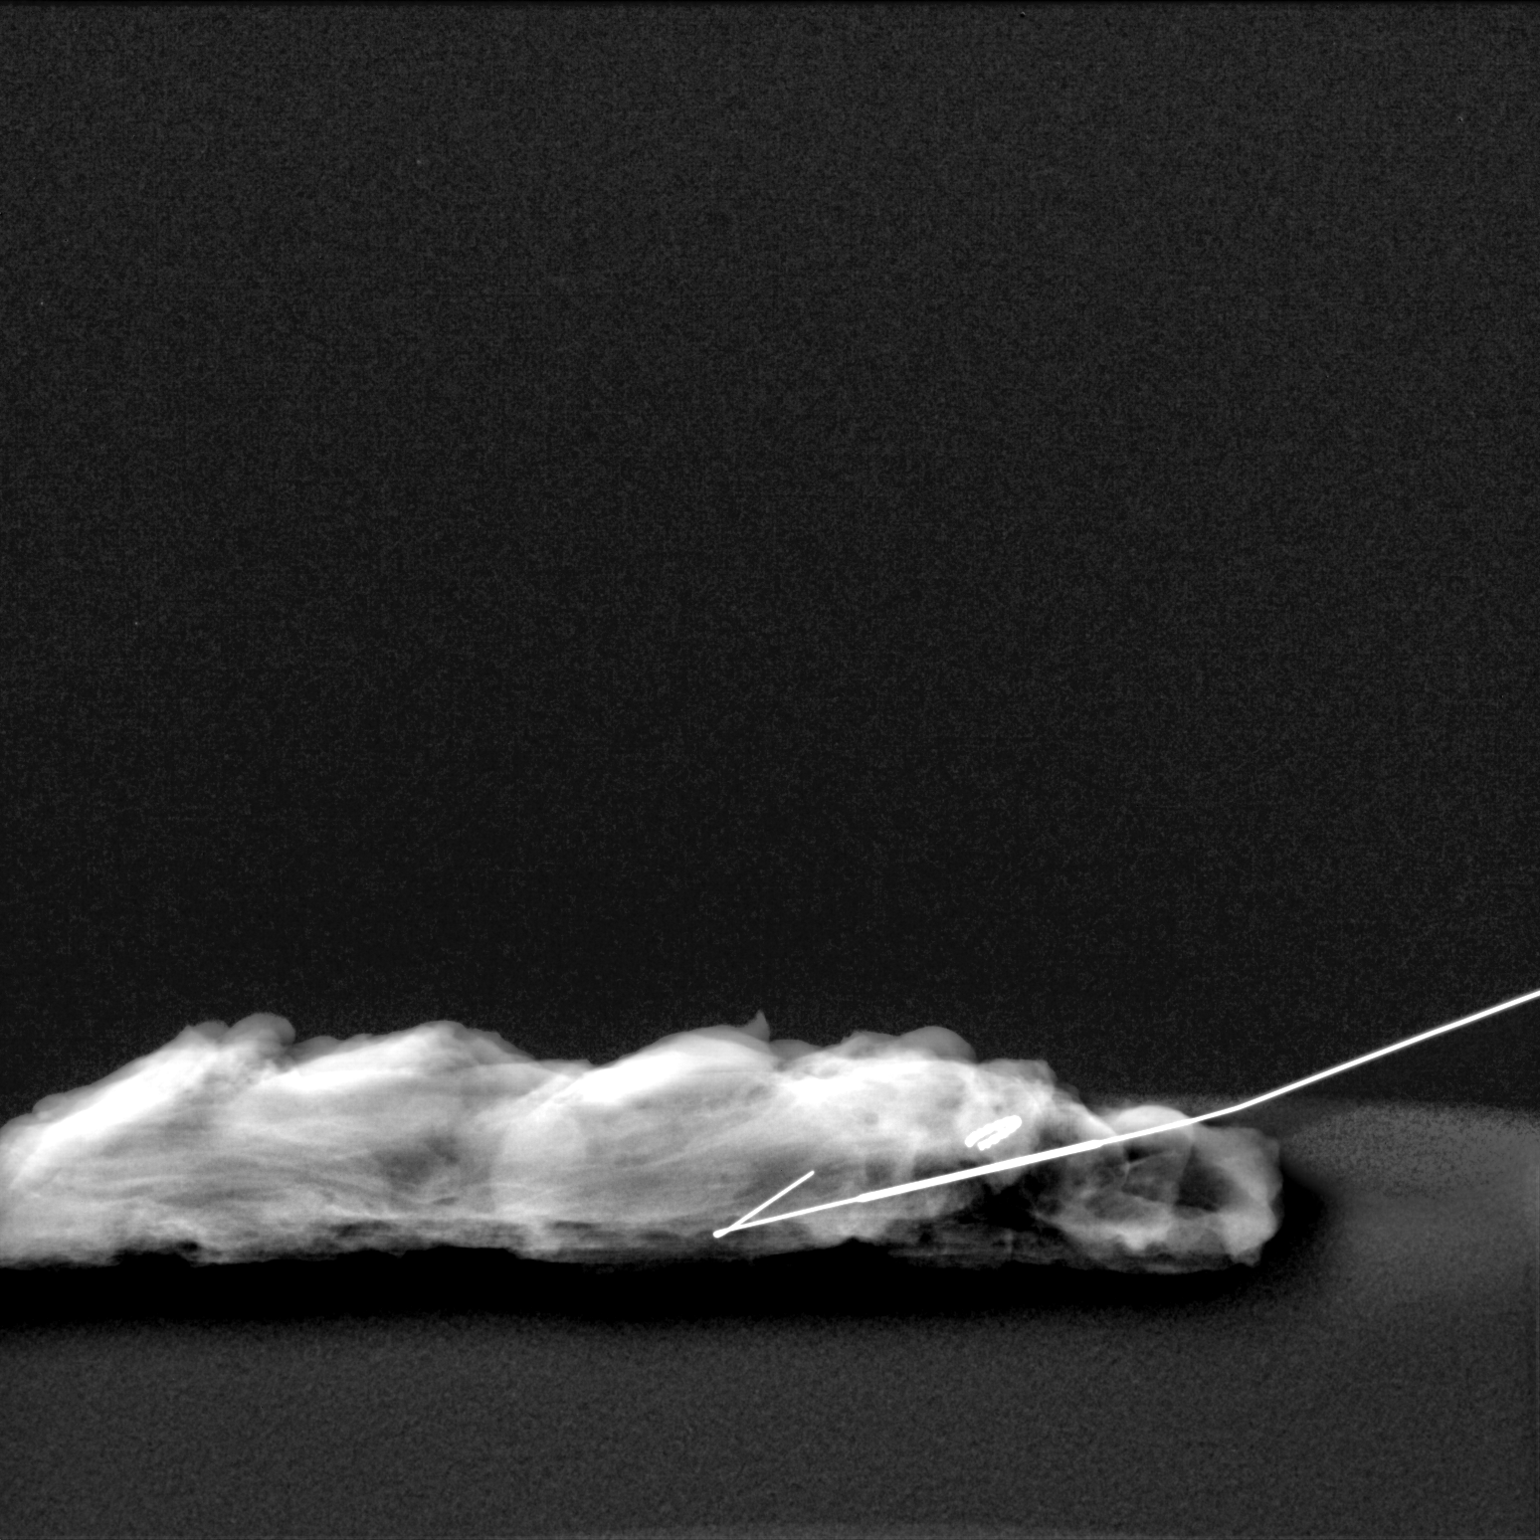
[im 2/2]
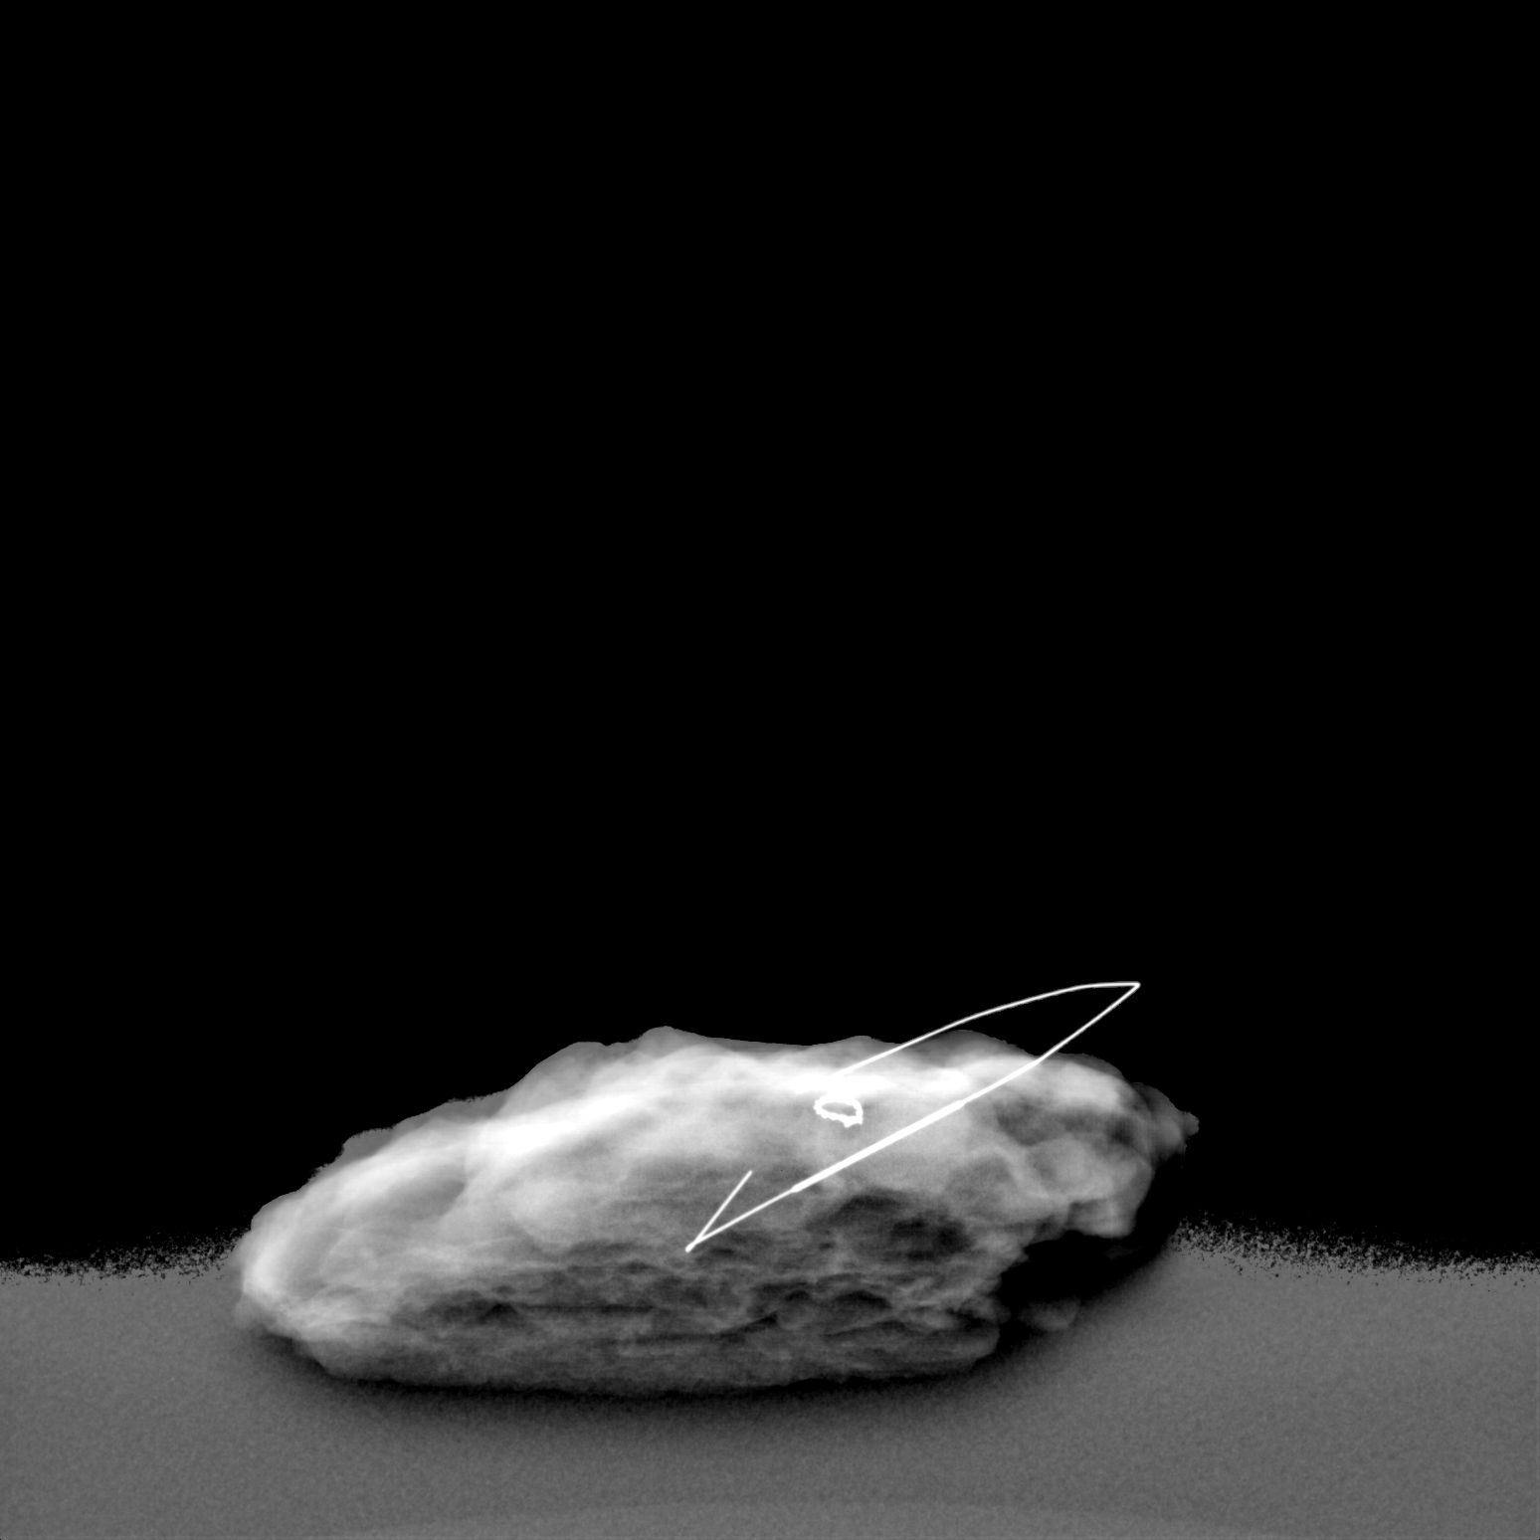

[2 of 2 positions shown; findings below may reference images not displayed]

FINDINGS: Status post excision of the right breast. The wire tip and biopsy
marker clip are present and are marked for pathology.
IMPRESSION: Specimen radiograph of the right breast.

## 2020-07-04 NOTE — Telephone Encounter (Signed)
Medication refill request: vitamin d  Last AEX:  12-15-19 BS Next AEX: not scheduled  Last MMG (if hormonal medication request): n/a Refill authorized: Today, please advise.  Medication pended for #12, 0 RF. Please refill if appropriate.

## 2020-07-27 NOTE — Progress Notes (Signed)
Patient Care Team: Lucianne Lei, MD as PCP - General (Family Medicine)  DIAGNOSIS:    ICD-10-CM   1. Ductal papillomatosis of right breast  N60.11     CHIEF COMPLIANT: Follow-up of high risk for breast cancer on tamoxifen  INTERVAL HISTORY: [employer] is a 45 y.o. with above-mentioned history of ductal papillomas of the right breast. She is currently on tamoxifen 10mg  daily. Mammogram on 01/04/20 showed no evidence of malignancy bilaterally. She presents to the clinic today for a toxicity check.    She had problems with Mirena contraception and therefore she stopped tamoxifen at the same time.  This was November 2021.  Prior to that she was tolerating tamoxifen fairly well at 10 mg dose.  ALLERGIES:  has No Known Allergies.  MEDICATIONS:  Current Outpatient Medications  Medication Sig Dispense Refill  . ibuprofen (ADVIL) 800 MG tablet Take 800 mg by mouth every 8 (eight) hours as needed.    . tamoxifen (NOLVADEX) 10 MG tablet Take 1 tablet (10 mg total) by mouth daily. 90 tablet 3  . VIRTUSSIN A/C 100-10 MG/5ML syrup Take by mouth.    . Vitamin D, Ergocalciferol, (DRISDOL) 1.25 MG (50000 UNIT) CAPS capsule TAKE 1 CAPSULE BY MOUTH EVERY 7 DAYS 12 capsule 0   No current facility-administered medications for this visit.    PHYSICAL EXAMINATION: ECOG PERFORMANCE STATUS: 1 - Symptomatic but completely ambulatory  Vitals:   07/28/20 1421  BP: (!) 144/92  Pulse: 97  Resp: 18  Temp: 97.7 F (36.5 C)  SpO2: 100%   Filed Weights   07/28/20 1421  Weight: 211 lb 12.8 oz (96.1 kg)    BREAST: No palpable masses or nodules in either right or left breasts. No palpable axillary supraclavicular or infraclavicular adenopathy no breast tenderness or nipple discharge. (exam performed in the presence of a chaperone)  LABORATORY DATA:  I have reviewed the data as listed CMP Latest Ref Rng & Units 12/15/2019 03/03/2019 02/26/2019  Glucose 65 - 99 mg/dL 93 103(H) 111(H)  BUN 6 - 24  mg/dL 9 14 7   Creatinine 0.57 - 1.00 mg/dL 0.76 0.70 0.77  Sodium 134 - 144 mmol/L 138 138 139  Potassium 3.5 - 5.2 mmol/L 4.1 3.5 4.4  Chloride 96 - 106 mmol/L 103 104 104  CO2 20 - 29 mmol/L 23 18(L) 21  Calcium 8.7 - 10.2 mg/dL 9.7 9.3 9.2  Total Protein 6.0 - 8.5 g/dL 7.3 - -  Total Bilirubin 0.0 - 1.2 mg/dL 0.2 - -  Alkaline Phos 48 - 121 IU/L 87 - -  AST 0 - 40 IU/L 12 - -  ALT 0 - 32 IU/L 17 - -    Lab Results  Component Value Date   WBC 9.8 12/15/2019   HGB 12.4 12/15/2019   HCT 39.8 12/15/2019   MCV 80 12/15/2019   PLT 275 12/15/2019   NEUTROABS 3.7 01/11/2016    ASSESSMENT & PLAN:  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 daily x5 years started 04/29/2019 stopped November 2021 because of problems with Mirena.  She will resume it May 2022 after a new Mirena can be placed.  Tamoxifen toxicities:  Previously tolerated tamoxifen extremely well. Patient has history of endometrial hyperplasia.    Currently on Mirena  Breast cancer surveillance:  1. Mammograms 03/16/2020: Benign breast density category B 2. breast exam 07/28/2020: Benign  Return to clinic in 1 year for follow-up    No orders of the defined types were placed in this encounter.  The patient has a good understanding of the overall plan. she agrees with it. she will call with any problems that may develop before the next visit here.  Total time spent: 20 mins including face to face time and time spent for planning, charting and coordination of care  Rulon Eisenmenger, MD, MPH 07/28/2020  I, Cloyde Reams Dorshimer, am acting as scribe for Dr. Nicholas Lose.  I have reviewed the above documentation for accuracy and completeness, and I agree with the above.

## 2020-07-28 ENCOUNTER — Other Ambulatory Visit: Payer: Self-pay

## 2020-07-28 ENCOUNTER — Inpatient Hospital Stay
Payer: No Typology Code available for payment source | Attending: Hematology and Oncology | Admitting: Hematology and Oncology

## 2020-07-28 DIAGNOSIS — N6011 Diffuse cystic mastopathy of right breast: Secondary | ICD-10-CM | POA: Diagnosis not present

## 2020-07-28 DIAGNOSIS — Z79810 Long term (current) use of selective estrogen receptor modulators (SERMs): Secondary | ICD-10-CM | POA: Diagnosis not present

## 2020-07-28 MED ORDER — TAMOXIFEN CITRATE 10 MG PO TABS: 10.0000 mg | ORAL_TABLET | Freq: Every day | ORAL | 3 refills | Status: DC

## 2020-07-28 NOTE — Assessment & Plan Note (Signed)
03/13/2019: Multiple lumpectomies: 1:00: Intraductal papilloma,[facility] 3:00: Retroareolar: Intraductal papilloma, [facility] 7:00: Intraductal papilloma,[facility] Additional areolar margin:[facility]  Risk reduction:Tamoxifen 10 mg p.o. daily x5 years started 04/29/2019 Tamoxifen toxicities: Tolerating tamoxifen extremely well. Patient has history of endometrial hyperplasia.  She is following with her gynecologist.  She does have intermittent bleeding.  Breast cancer surveillance:  1. Mammograms [facility] 03/16/2020: Benign breast density category B 2. breast exam 07/28/2020: Benign  Return to clinic in 1 year for follow-up

## 2020-11-21 ENCOUNTER — Other Ambulatory Visit: Payer: Self-pay | Admitting: Family Medicine

## 2020-11-21 DIAGNOSIS — Z1231 Encounter for screening mammogram for malignant neoplasm of breast: Secondary | ICD-10-CM

## 2021-01-11 NOTE — Progress Notes (Signed)
45 y.o. [id number] Divorced [place] American female here for annual exam.    Only had some spotting in April. Has had 2 - 3 cycles since her surgery 03/03/19. No hot flashes.  Wants to discuss getting another Mirena IUD.  Off Tamoxifen this year. She will restart Tamoxifen after she received the Mirena IUD.   Dilation and curettage done 03/03/19 showing benign secretory endometrium.   Sister marrying in [place] in November.   PCP:  Lucianne Lei, MD  Patient's last menstrual period was 08/26/2020 (approximate).     Period Pattern: (!) Irregular     Sexually active: No. Occ. The current method of family planning is Abstinence/condoms.    Exercising: No.  The patient does not participate in regular exercise at present. Smoker:  Former  Health Maintenance: Pap: 12/11/18 Neg:Neg HR HPV, 06/23/12 Neg:Neg HR HPV History of abnormal Pap:  no MMG:  Today--pend. Colonoscopy:  n/a BMD:   n/a  Result  n/a TDaP:  12-11-18 Gardasil:   yes HIV:12-15-19 NR Hep C:12-15-19 Neg Screening Labs:  PCP.    reports that she has quit smoking. She has never used smokeless tobacco. She reports current alcohol use. She reports that she does not use drugs.  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  Slides reread by [facility] and determined to be simple endometrial hyperplasia.   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   Elevated hemoglobin A1c 2021   Family history of breast cancer    Family history of ovarian cancer    Family history of pancreatic cancer    Family history of prostate cancer    High anti-Mullerian hormone 2020   Hyperlipidemia    Medical history non-contributory    Nipple discharge    left breast   PCOS (polycystic ovarian syndrome)    Sleep apnea    has a cpap for 3 yr-told she will need to bring day of  surgery and use post op    Past Surgical History:  Procedure Laterality Date   BREAST DUCTAL SYSTEM EXCISION Left 11/24/2012   Procedure: EXCISION DUCTAL SYSTEM BREAST;  Surgeon: Haywood Lasso, MD;  Location: [place];  Service: General;  Laterality: Left;   BREAST EXCISIONAL BIOPSY Left 2014   BREAST EXCISIONAL BIOPSY Right 03/13/2019   BREAST EXCISIONAL BIOPSY Right 03/13/2019   BREAST LUMPECTOMY WITH NEEDLE LOCALIZATION Right 03/13/2019   Procedure: RIGHT BREAST LUMPECTOMY WITH NEEDLE LOCALIZATION X2;  Surgeon: Fanny Skates, MD;  Location: [place];  Service: General;  Laterality: Right;   BREAST LUMPECTOMY WITH RADIOACTIVE SEED LOCALIZATION Right 03/13/2019   Procedure: RIGHT  BREAST LUMPECTOMY WITH RADIOACTIVE SEED LOCALIZATION;  Surgeon: Fanny Skates, MD;  Location: [place];  Service: General;  Laterality: Right;   CARPAL TUNNEL RELEASE Right 04/20/2019   Procedure: RIGHT CARPAL TUNNEL RELEASE;  Surgeon: Leanora Cover, MD;  Location: [place];  Service: Orthopedics;  Laterality: Right;   CARPAL TUNNEL RELEASE Left 05/15/2019   Procedure: LEFT CARPAL TUNNEL RELEASE;  Surgeon: Leanora Cover, MD;  Location: [place];  Service: Orthopedics;  Laterality: Left;   DILATATION & CURETTAGE/HYSTEROSCOPY WITH MYOSURE N/A 03/03/2019   Procedure: DILATATION & CURETTAGE/HYSTEROSCOPY;  Surgeon: Nunzio Cobbs, MD;  Location: [facility];  Service: Gynecology;  Laterality: N/A;   NO PAST SURGERIES      Current Outpatient Medications  Medication Sig Dispense Refill   tamoxifen (NOLVADEX) 10 MG tablet Take 1 tablet (10 mg total) by mouth daily. (Patient not taking: Reported on 01/13/2021) 90 tablet 3   No current facility-administered medications for this visit.    Family History  Problem Relation Age of Onset   Cancer Mother 33       breast   Breast cancer Mother    Diabetes  Father    Cancer Maternal Grandmother        breast--dec age 65   Breast cancer Maternal Grandmother    Cancer Maternal Aunt        unsure type, possibly pancreatic   Ovarian cancer Paternal Grandmother 13   Prostate cancer Paternal Uncle 48    Review of Systems  All other systems reviewed and are negative.  Exam:   BP 124/84   Pulse 88   Ht 5' 3.5" (1.613 m)   Wt 208 lb (94.3 kg)   LMP 08/26/2020 (Approximate) Comment: spotting only  SpO2 98%   BMI 36.27 kg/m     General appearance: alert, cooperative and appears stated age Head: normocephalic, without obvious abnormality, atraumatic Neck: no adenopathy, supple, symmetrical, trachea midline and thyroid normal to inspection and palpation Lungs: clear to auscultation bilaterally Breasts: right - normal appearance,  skin superficial 5 mm lump at 6:00 about 3 cm below the nipple, no tenderness, No nipple retraction or dimpling, No nipple discharge or bleeding, No axillary adenopathy Left - normal appearance, no mass or tenderness, No nipple retraction or dimpling, No nipple discharge or bleeding, No axillary adenopathy Heart: regular rate and rhythm Abdomen: soft, non-tender; no masses, no organomegaly Extremities: extremities normal, atraumatic, no cyanosis or edema Skin: skin color, texture, turgor normal. No rashes or lesions Lymph nodes: cervical, supraclavicular, and axillary nodes normal. Neurologic: grossly normal  Pelvic: External genitalia:  no lesions              No abnormal inguinal nodes palpated.              Urethra:  normal appearing urethra with no masses, tenderness or lesions              Bartholins and Skenes: normal                 Vagina: normal appearing vagina with normal color and discharge, no lesions              Cervix: no lesions              Pap taken: no. Bimanual Exam:  Uterus:  normal size, contour, position, consistency, mobility, non-tender              Adnexa: no mass, fullness, tenderness               Rectal exam: Yes.  .  Confirms.              Anus:  normal sphincter tone, no lesions  Chaperone was present for exam:  Estill Bamberg, CMA Assessment:   Well woman visit with gynecologic exam. Hx oligomenorrhea.  Noted to have hyperplasia with office EMB and then secretory endometrium on endometrial curettage.  Hx expelled Mirena.  Off Tamoxifen (for breast cancer risk reduction). Negative genetic testing.   Has variants of undetermined significance.  Sees oncology yearly.  Elevated glucose.  Right breast skin superficial lump at 6:00 about 3 cm below  the nipple.   Plan: Mammogram screening discussed. Self breast awareness reviewed. Pap and HR HPV 2025. Guidelines for Calcium, Vitamin D, regular exercise program including cardiovascular and weight bearing exercise. STD screening to [place].  [place] and estradiol to [place].   If not menopausal, return for EMB and then potential Mirena after results are back.  Follow up annually and prn.    After visit summary provided.

## 2021-01-13 ENCOUNTER — Ambulatory Visit (INDEPENDENT_AMBULATORY_CARE_PROVIDER_SITE_OTHER): Payer: No Typology Code available for payment source | Admitting: Obstetrics and Gynecology

## 2021-01-13 ENCOUNTER — Ambulatory Visit
Admission: RE | Admit: 2021-01-13 | Discharge: 2021-01-13 | Disposition: A | Discharge status: Home / Self Care | Payer: No Typology Code available for payment source | Source: Ambulatory Visit

## 2021-01-13 ENCOUNTER — Other Ambulatory Visit: Payer: Self-pay

## 2021-01-13 ENCOUNTER — Encounter: Payer: Self-pay | Admitting: Obstetrics and Gynecology

## 2021-01-13 ENCOUNTER — Ambulatory Visit: Payer: No Typology Code available for payment source | Admitting: Obstetrics and Gynecology

## 2021-01-13 VITALS — BP 124/84 | HR 88 | Ht 63.5 in | Wt 208.0 lb

## 2021-01-13 DIAGNOSIS — Z1231 Encounter for screening mammogram for malignant neoplasm of breast: Secondary | ICD-10-CM

## 2021-01-13 DIAGNOSIS — N926 Irregular menstruation, unspecified: Secondary | ICD-10-CM | POA: Diagnosis not present

## 2021-01-13 DIAGNOSIS — Z113 Encounter for screening for infections with a predominantly sexual mode of transmission: Secondary | ICD-10-CM

## 2021-01-13 DIAGNOSIS — Z01419 Encounter for gynecological examination (general) (routine) without abnormal findings: Secondary | ICD-10-CM

## 2021-01-13 LAB — PREGNANCY, URINE: Preg Test, Ur: NEGATIVE

## 2021-01-13 NOTE — Patient Instructions (Signed)

## 2021-01-14 LAB — HEP, RPR, HIV PANEL
HIV Screen 4th Generation wRfx: NONREACTIVE
Hepatitis B Surface Ag: NEGATIVE
RPR Ser Ql: NONREACTIVE

## 2021-01-14 LAB — FOLLICLE STIMULATING HORMONE: FSH: 6.4 m[IU]/mL

## 2021-01-14 LAB — HEPATITIS C ANTIBODY: Hep C Virus Ab: <0.1 s/co ratio (ref 0.0–0.9)

## 2021-01-14 LAB — ESTRADIOL: Estradiol: 46.5 pg/mL

## 2021-01-16 ENCOUNTER — Other Ambulatory Visit: Payer: Self-pay | Admitting: *Deleted

## 2021-01-16 DIAGNOSIS — N926 Irregular menstruation, unspecified: Secondary | ICD-10-CM

## 2021-01-18 LAB — HCV INTERPRETATION

## 2021-01-18 LAB — STI PROFILE, CT/NG/TV
Chlamydia by NAA: NEGATIVE
Gonococcus by NAA: NEGATIVE
HCV Ab: <0.1 s/co ratio (ref 0.0–0.9)
HIV Screen 4th Generation wRfx: NONREACTIVE
Hep B Core Total Ab: NEGATIVE
Hep B Surface Ab, Qual: NONREACTIVE
Hepatitis B Surface Ag: NEGATIVE
RPR Ser Ql: NONREACTIVE
Trich vag by NAA: NEGATIVE

## 2021-03-08 ENCOUNTER — Ambulatory Visit: Payer: No Typology Code available for payment source | Admitting: Obstetrics and Gynecology

## 2021-04-12 ENCOUNTER — Encounter: Payer: Self-pay | Admitting: Obstetrics and Gynecology

## 2021-04-12 ENCOUNTER — Ambulatory Visit (INDEPENDENT_AMBULATORY_CARE_PROVIDER_SITE_OTHER): Payer: No Typology Code available for payment source | Admitting: Obstetrics and Gynecology

## 2021-04-12 ENCOUNTER — Other Ambulatory Visit (HOSPITAL_COMMUNITY)
Admission: RE | Admit: 2021-04-12 | Discharge: 2021-04-12 | Disposition: A | Discharge status: Home / Self Care | Payer: No Typology Code available for payment source | Source: Ambulatory Visit | Attending: Obstetrics and Gynecology | Admitting: Obstetrics and Gynecology

## 2021-04-12 ENCOUNTER — Other Ambulatory Visit: Payer: Self-pay

## 2021-04-12 VITALS — BP 112/78 | HR 76 | Ht 63.25 in | Wt 208.0 lb

## 2021-04-12 DIAGNOSIS — N915 Oligomenorrhea, unspecified: Secondary | ICD-10-CM | POA: Insufficient documentation

## 2021-04-12 DIAGNOSIS — Z8742 Personal history of other diseases of the female genital tract: Secondary | ICD-10-CM

## 2021-04-12 NOTE — Patient Instructions (Signed)
Endometrial Biopsy ?An endometrial biopsy is a procedure to remove tissue samples from the endometrium, which is the lining of the uterus. The tissue that is removed can then be checked under a microscope for disease. ?This procedure is used to diagnose conditions such as endometrial cancer, endometrial tuberculosis, polyps, or other inflammatory conditions. This procedure may also be used to investigate uterine bleeding to determine where you are in your menstrual cycle or how your hormone levels are affecting the lining of the uterus. ?Tell a health care provider about: ?Any allergies you have. ?All medicines you are taking, including vitamins, herbs, eye drops, creams, and over-the-counter medicines. ?Any problems you or family members have had with anesthetic medicines. ?Any blood disorders you have. ?Any surgeries you have had. ?Any medical conditions you have. ?Whether you are pregnant or may be pregnant. ?What are the risks? ?Generally, this is a safe procedure. However, problems may occur, including: ?Bleeding. ?Pelvic infection. ?Puncture of the wall of the uterus with the biopsy device (rare). ?Allergic reactions to medicines. ?What happens before the procedure? ?Keep a record of your menstrual cycles as told by your health care provider. You may need to schedule your procedure for a specific time in your cycle. ?You may want to bring a sanitary pad to wear after the procedure. ?Plan to have someone take you home from the hospital or clinic. ?Ask your health care provider about: ?Changing or stopping your regular medicines. This is especially important if you are taking diabetes medicines, arthritis medicines, or blood thinners. ?Taking medicines such as aspirin and ibuprofen. These medicines can thin your blood. Do not take these medicines unless your health care provider tells you to take them. ?Taking over-the-counter medicines, vitamins, herbs, and supplements. ?What happens during the procedure? ?You  will lie on an exam table with your feet and legs supported as in a pelvic exam. ?Your health care provider will insert an instrument (speculum) into your vagina to see your cervix. ?Your cervix will be cleansed with an antiseptic solution. ?A medicine (local anesthetic) will be used to numb the cervix. ?A forceps instrument (tenaculum) will be used to hold your cervix steady for the biopsy. ?A thin, rod-like instrument (uterine sound) will be inserted through your cervix to determine the length of your uterus and the location where the biopsy sample will be removed. ?A thin, flexible tube (catheter) will be inserted through your cervix and into the uterus. The catheter will be used to collect the biopsy sample from your endometrial tissue. ?The catheter and speculum will then be removed, and the tissue sample will be sent to a lab for examination. ?The procedure may vary among health care providers and hospitals. ?What can I expect after procedure? ?You will rest in a recovery area until you are ready to go home. ?You may have mild cramping and a small amount of vaginal bleeding. This is normal. ?You may have a small amount of vaginal bleeding for a few days. This is normal. ?It is up to you to get the results of your procedure. Ask your health care provider, or the department that is doing the procedure, when your results will be ready. ?Follow these instructions at home: ?Take over-the-counter and prescription medicines only as told by your health care provider. ?Do not douche, use tampons, or have sexual intercourse until your health care provider approves. ?Return to your normal activities as told by your health care provider. Ask your health care provider what activities are safe for you. ?Follow instructions   from your health care provider about any activity restrictions, such as restrictions on strenuous exercise or heavy lifting. ?Keep all follow-up visits. This is important. ?Contact a health care  provider: ?You have heavy bleeding, or bleed for longer than 2 days after the procedure. ?You have bad smelling discharge from your vagina. ?You have a fever or chills. ?You have a burning sensation when urinating or you have difficulty urinating. ?You have severe pain in your lower abdomen. ?Get help right away if you: ?You have severe cramps in your stomach or back. ?You pass large blood clots. ?Your bleeding increases. ?You become weak or light-headed, or you faint or lose consciousness. ?Summary ?An endometrial biopsy is a procedure to remove tissue samples is taken from the endometrium, which is the lining of the uterus. ?The tissue sample that is removed will be checked under a microscope for disease. ?This procedure is used to diagnose conditions such as endometrial cancer, endometrial tuberculosis, polyps, or other inflammatory conditions. ?After the procedure, it is common to have mild cramping and a small amount of vaginal bleeding for a few days. ?Do not douche, use tampons, or have sexual intercourse until your health care provider approves. Ask your health care provider which activities are safe for you. ?This information is not intended to replace advice given to you by your health care provider. Make sure you discuss any questions you have with your health care provider. ?Document Revised: 01/25/2021 Document Reviewed: 12/07/2019 ?Elsevier Patient Education ? 2022 Elsevier Inc. ? ?

## 2021-04-12 NOTE — Progress Notes (Signed)
GYNECOLOGY  VISIT   HPI: 45 y.o.   Divorced  [place] American  female   [phone] with Patient's last menstrual period was 03/08/2021 (exact date).   here for EMB.     No cycles in September.  LMP 03-08-21 and starting spotting 04-10-21.  Labs on 01/13/21:  estradiol 46.5 and FSH 6.4  Patient is not on Tamoxifen for about one year.  She is awaiting Mirena IUD insertion first.   She has a history of an expelled IUD.   GYNECOLOGIC HISTORY: Patient's last menstrual period was 03/08/2021 (exact date). Contraception:  Abstinence--pt.states no intercourse since 08/2020 Menopausal hormone therapy:  none Last mammogram: 01-13-21 3D/Neg/BiRads1  Last pap smear:  12/11/18 Neg:Neg HR HPV, 06/23/12 Neg:Neg HR HPV        OB History     Gravida  3   Para      Term      Preterm      AB  3   Living         SAB  3   IAB      Ectopic      Multiple      Live Births                 Patient Active Problem List   Diagnosis Date Noted   Genetic testing 05/20/2019   Family history of breast cancer    Family history of ovarian cancer    Family history of pancreatic cancer    Family history of prostate cancer    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  Slides reread by [facility] and determined to be simple endometrial hyperplasia.   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   Elevated hemoglobin A1c 2021   Family history of breast cancer    Family history of ovarian cancer    Family history of pancreatic cancer    Family history of prostate cancer    High  anti-Mullerian hormone 2020   Hyperlipidemia    Medical history non-contributory    Nipple discharge    left breast   PCOS (polycystic ovarian syndrome)    Sleep apnea    has a cpap for 3 yr-told she will need to bring day of surgery and use post op    Past Surgical History:  Procedure Laterality Date   BREAST DUCTAL SYSTEM EXCISION Left 11/24/2012   Procedure: EXCISION DUCTAL SYSTEM BREAST;  Surgeon: Haywood Lasso, MD;  Location: [place];  Service: General;  Laterality: Left;   BREAST EXCISIONAL BIOPSY Left 2014   BREAST EXCISIONAL BIOPSY Right 03/13/2019   BREAST EXCISIONAL BIOPSY Right 03/13/2019   BREAST LUMPECTOMY WITH NEEDLE LOCALIZATION Right 03/13/2019   Procedure: RIGHT BREAST LUMPECTOMY WITH NEEDLE LOCALIZATION X2;  Surgeon: Fanny Skates, MD;  Location: [place];  Service: General;  Laterality: Right;   BREAST LUMPECTOMY WITH RADIOACTIVE SEED LOCALIZATION Right 03/13/2019   Procedure: RIGHT  BREAST LUMPECTOMY WITH RADIOACTIVE SEED LOCALIZATION;  Surgeon: Fanny Skates, MD;  Location: [place];  Service: General;  Laterality: Right;  CARPAL TUNNEL RELEASE Right 04/20/2019   Procedure: RIGHT CARPAL TUNNEL RELEASE;  Surgeon: Leanora Cover, MD;  Location: [place];  Service: Orthopedics;  Laterality: Right;   CARPAL TUNNEL RELEASE Left 05/15/2019   Procedure: LEFT CARPAL TUNNEL RELEASE;  Surgeon: Leanora Cover, MD;  Location: [place];  Service: Orthopedics;  Laterality: Left;   DILATATION & CURETTAGE/HYSTEROSCOPY WITH MYOSURE N/A 03/03/2019   Procedure: DILATATION & CURETTAGE/HYSTEROSCOPY;  Surgeon: Nunzio Cobbs, MD;  Location: [facility];  Service: Gynecology;  Laterality: N/A;   NO PAST SURGERIES      Current Outpatient Medications  Medication Sig Dispense Refill   atorvastatin (LIPITOR) 10 MG tablet Take 10 mg by mouth daily.     omeprazole (PRILOSEC) 40  MG capsule Take 40 mg by mouth every morning.     tamoxifen (NOLVADEX) 10 MG tablet Take 1 tablet (10 mg total) by mouth daily. (Patient not taking: No sig reported) 90 tablet 3   No current facility-administered medications for this visit.     ALLERGIES: Patient has no known allergies.  Family History  Problem Relation Age of Onset   Cancer Mother 91       breast   Breast cancer Mother    Diabetes Father    Cancer Maternal Grandmother        breast--dec age 72   Breast cancer Maternal Grandmother    Cancer Maternal Aunt        unsure type, possibly pancreatic   Ovarian cancer Paternal Grandmother 2   Prostate cancer Paternal Uncle 64    Social History   Socioeconomic History   Marital status: Divorced    Spouse name: Not on file   Number of children: Not on file   Years of education: Not on file   Highest education level: Not on file  Occupational History   Not on file  Tobacco Use   Smoking status: Former   Smokeless tobacco: Never   Tobacco comments:    per patient smokes hemp smoked 04/18/19 per pt  Vaping Use   Vaping Use: Never used  Substance and Sexual Activity   Alcohol use: Yes    Comment: 2 drinks/month   Drug use: No   Sexual activity: Not Currently    Partners: Male    Birth control/protection: None, Condom, Abstinence  Other Topics Concern   Not on file  Social History Narrative   Single, no children   Right handed   Some college   < 1 cup daily   Social Determinants of Health   Financial Resource Strain: Not on file  Food Insecurity: Not on file  Transportation Needs: Not on file  Physical Activity: Not on file  Stress: Not on file  Social Connections: Not on file  Intimate Partner Violence: Not on file    Review of Systems  All other systems reviewed and are negative.  PHYSICAL EXAMINATION:    BP 112/78   Pulse 76   Ht 5' 3.25" (1.607 m)   Wt 208 lb (94.3 kg)   LMP 03/08/2021 (Exact Date)   BMI 36.55 kg/m     General  appearance: alert, cooperative and appears stated age  Pelvic: External genitalia:  no lesions              Urethra:  normal appearing urethra with no masses, tenderness or lesions              Bartholins and Skenes: normal  Vagina: normal appearing vagina with normal color and discharge, no lesions              Cervix: no lesions                Bimanual Exam:  Uterus:  normal size, contour, position, consistency, mobility, non-tender              Adnexa: no mass, fullness, tenderness                EMB Consent done.  Sterile prep with Hibiclens.  Paracervical block with 10 cc 1% lidocaine, lot [id number], exp 08/2022.  Pipelle passed x 2 to 8 cm.  Tissue to pathology.  No complications.  Minimal EBL.  Chaperone was present for exam:  Estill Bamberg, CMA  ASSESSMENT  Hx oligomenorrhea.  Hx endometrial hyperplasia.  Off Tamoxifen for breast cancer risk reduction.  Hx expelled Mirena.   PLAN  FU EMB results.  Plan for US guided IUD insertion after results are back.    An After Visit Summary was printed and given to the patient.

## 2021-04-14 LAB — SURGICAL PATHOLOGY

## 2021-04-18 ENCOUNTER — Other Ambulatory Visit: Payer: Self-pay

## 2021-04-18 DIAGNOSIS — Z30430 Encounter for insertion of intrauterine contraceptive device: Secondary | ICD-10-CM

## 2021-04-24 ENCOUNTER — Telehealth: Payer: Self-pay

## 2021-04-24 NOTE — Telephone Encounter (Signed)
Transferred to triage from "billing' line.  Patient is inquiring about a form for time out of work to be able to have procedure several weeks ago. She said she paid $10 at front desk.  I do not see if scanned into media manager yet. She is asking if she can get a copy because her job has not yet received it.  Routed to [facility].

## 2021-04-24 NOTE — Telephone Encounter (Signed)
Spoke with patient regarding form to be completed regarding in office procedure. Form completed and faxed. Informed patient that I would mail her a copy.  Patient appreciative.  Encounter closed.

## 2021-06-01 ENCOUNTER — Other Ambulatory Visit: Payer: No Typology Code available for payment source | Admitting: Obstetrics and Gynecology

## 2021-06-01 ENCOUNTER — Other Ambulatory Visit: Payer: No Typology Code available for payment source

## 2021-07-27 ENCOUNTER — Other Ambulatory Visit: Payer: Self-pay

## 2021-07-27 ENCOUNTER — Inpatient Hospital Stay: Payer: No Typology Code available for payment source | Attending: Adult Health | Admitting: Adult Health

## 2021-07-27 VITALS — BP 133/86 | HR 82 | Temp 97.7°F | Resp 16 | Ht 63.0 in | Wt 208.2 lb

## 2021-07-27 DIAGNOSIS — Z87891 Personal history of nicotine dependence: Secondary | ICD-10-CM | POA: Insufficient documentation

## 2021-07-27 DIAGNOSIS — K921 Melena: Secondary | ICD-10-CM | POA: Diagnosis not present

## 2021-07-27 DIAGNOSIS — Z8041 Family history of malignant neoplasm of ovary: Secondary | ICD-10-CM | POA: Diagnosis not present

## 2021-07-27 DIAGNOSIS — Z800 Family history of malignant neoplasm of digestive organs: Secondary | ICD-10-CM | POA: Insufficient documentation

## 2021-07-27 DIAGNOSIS — Z9189 Other specified personal risk factors, not elsewhere classified: Secondary | ICD-10-CM

## 2021-07-27 DIAGNOSIS — D241 Benign neoplasm of right breast: Secondary | ICD-10-CM | POA: Diagnosis not present

## 2021-07-27 DIAGNOSIS — Z803 Family history of malignant neoplasm of breast: Secondary | ICD-10-CM | POA: Insufficient documentation

## 2021-07-27 DIAGNOSIS — N6011 Diffuse cystic mastopathy of right breast: Secondary | ICD-10-CM

## 2021-07-27 MED ORDER — TAMOXIFEN CITRATE 10 MG PO TABS: 10.0000 mg | ORAL_TABLET | Freq: Every day | ORAL | 3 refills | Status: DC

## 2021-07-27 NOTE — Assessment & Plan Note (Signed)
Risk reduction:?Tamoxifen 10 mg p.o. daily x5 years started 04/29/2019; stopped in 2021 and to restart March 2023. ? ?Kristy Stafford and I discussed her breast cancer risk in detail.  I calculated her Cristine Polio Cusick risk which gives her 21% lifetime risk for breast cancer which is twice as much as someone else with her similar age and race.  For this reason she is eligible for either intensified screening or to restart risk reduction with tamoxifen.  We discussed both of these in detail. ? ?1.  Intensified screening includes annual screening mammogram with supplementary breast MRI 6 months later.  We discussed the risks and benefits of this approach.  We reviewed the increased risk for false positives in addition the potential cost of the MRI. ? ?2.  Risk reduction includes restarting tamoxifen 10 mg daily.  She can work with her gynecologist going ahead and get her IUD replaced due to her history of endometrial hyperplasia.  At 10 mg we are not as concerned as we will be at 20 mg for continued worsening of the hyperplasia, however that is something that would need to be monitored closely. ? ?After discussion Kristy Stafford would like to continue with annual mammograms and restarting tamoxifen at 10 mg daily.  I sent this in for her.  She will follow-up with Dr. Quincy Simmonds in May 2023.  Should she decide to have additional MRI we are happy to place that order for her but at this point she would like to hold off on it. ? ?We also discussed the issue of her rectal bleeding.  I placed a referral for [facility] to see if they have any different financial policy about upfront payments/payment plans since she is having trouble accessing care due to cost despite having insurance.  We also discussed possibly taking Colace daily every other day for constipation to keep her stool soft and prevent any anal fissures.  I suggested since she still having the hematochezia she could take a multivitamin with iron which may help prevent iron loss which can  happen in instances such as these. ? ?We also discussed other risk reduction methods including healthy diet and exercise.  I recommended 150 to 300 minutes a week as this is the number that has shown lower rates of cancer. ? ?We will see Kristy Stafford back in November 2023.  This will put [place] on an alternating schedule with Dr. Quincy Simmonds with a 6 months split. ? ? ?

## 2021-07-27 NOTE — Progress Notes (Signed)
[facility] Follow up:    Kristy Stafford, Kristy Stafford [id number]   DIAGNOSIS: intraductal papilloma and increased breast cancer risk  SUMMARY OF HIGH RISK HISTORY:  Ductal papillomatosis of right breast: 03/13/2019: Multiple lumpectomies:1:00: intraductal papilloma, [facility]; 3:00: Retroareolar: Intraductal papilloma, [facility]; 7:00: Intraductal papilloma, [facility];Additional areolar margin:[facility] Genetic testing 05/20/2019: Negative.  Genetic testing did identify 3 Variants of uncertain significance (VUS) - one in the DICER1 gene called c.493T>G, a second in the MSH3 gene called c.287C>T, and a third in the [facility] gene called c.3436G>A.  Genes tested:  AIP, ALK, APC, ATM, AXIN2,BAP1,  BARD1, BLM, BMPR1A, BRCA1, BRCA2, BRIP1, CASR, CDC73, CDH1, CDK4, CDKN1B, CDKN1C, CDKN2A (p14ARF), CDKN2A (p16INK4a), CEBPA, CHEK2, CTNNA1, DICER1, DIS3L2, EGFR (c.2369C>T, p.Thr790Met variant only), EPCAM (Deletion/duplication testing only), FH, FLCN, GATA2, GPC3, GREM1 (Promoter region deletion/duplication testing only), HOXB13 (c.251G>A, p.Gly84Glu), HRAS, KIT, MAX, MEN1, MET, MITF (c.952G>A, p.Glu318Lys variant only), MLH1, MSH2, MSH3, MSH6, MUTYH, NBN, NF1, NF2, NTHL1, PALB2, PDGFRA, PHOX2B, PMS2, POLD1, POLE, POT1, PRKAR1A, PTCH1, PTEN, RAD50, RAD51C, RAD51D, RB1, RECQL4, RET, RNF43, RUNX1, SDHAF2, SDHA (sequence changes only), SDHB, SDHC, SDHD, SMAD4, SMARCA4, SMARCB1, SMARCE1, STK11, SUFU, TERC, TERT, TMEM127, TP53, TSC1, TSC2, VHL, WRN and WT1.  Tyrer Cuzick: Lifetime cancer risk 21.8% Risk reduction: Tamoxifen 10 mg p.o. daily x5 years started 04/29/2019 stopped November 2021 because of problems with Mirena; will restart March 2023 Intensified Screening: She will continue undergoing annual 3D mammogram; she is eligible for intensified screening with breast MRI.  She would like to wait on doing this because of her out-of-pocket cost with her insurance deductible.   CURRENT THERAPY: To  start back on tamoxifen  INTERVAL HISTORY: Kristy Stafford 46 y.o. female returns for evaluation of her increased risk of breast cancer secondary to ductal papillomatosis. She has been doing moderately well.  She was going to undergo IUD reinsertion after undergoing evaluation for endometrial hyperplasia.  She underwent biopsy that was negative.  She is awaiting reinsertion of her IUD, however her co-pay/deductible is very high and she is hoping to slowly knock down at her deductible to make this more affordable for her.  Her most recent mammogram was completed on January 16, 2021.  This showed no evidence of malignancy and breast density category B.  Kristy Stafford notes that she works at [employer], and she sees her primary care provider Dr. Criss Rosales regularly.  She notes that her last set of labs were completed at the end of 2022 and were stable.  Kristy Stafford noted blood in her stool beginning on 02/2021.  She was referred to Dr. Collene Mares and since the patient has a high deductible health plan, her out of pocket cost was too high and they required payment of the MD fee up front.  She was unable to afford this and has put it off.  Since that time she has still noted blood in her stool.  She says this is typically seen as blood streaked stool.  She denies any melena.  Initially her stool had some mucous and blood, however since that time she it has been mainly blood streaked.    Patient Active Problem List   Diagnosis Date Noted   Genetic testing 05/20/2019   Family history of breast cancer    Family history of ovarian cancer    Family history of pancreatic cancer    Family history of prostate cancer    Ductal papillomatosis of right breast 03/13/2019   Bilateral carpal tunnel  syndrome 01/09/2019   Conductive hearing loss in right ear 01/22/2018   Right ear impacted cerumen 01/22/2018   Simple endometrial hyperplasia 03/12/2013    has No Known Allergies.  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  Slides reread by [facility] and determined to be simple endometrial hyperplasia.   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   Elevated hemoglobin A1c 2021   Family history of breast cancer    Family history of ovarian cancer    Family history of pancreatic cancer    Family history of prostate cancer    High anti-Mullerian hormone 2020   Hyperlipidemia    Medical history non-contributory    Nipple discharge    left breast   PCOS (polycystic ovarian syndrome)    Sleep apnea    has a cpap for 3 yr-told she will need to bring day of surgery and use post op    SURGICAL HISTORY: Past Surgical History:  Procedure Laterality Date   BREAST DUCTAL SYSTEM EXCISION Left 11/24/2012   Procedure: EXCISION DUCTAL SYSTEM BREAST;  Surgeon: Haywood Lasso, MD;  Location: [place];  Service: General;  Laterality: Left;   BREAST EXCISIONAL BIOPSY Left 2014   BREAST EXCISIONAL BIOPSY Right 03/13/2019   BREAST EXCISIONAL BIOPSY Right 03/13/2019   BREAST LUMPECTOMY WITH NEEDLE LOCALIZATION Right 03/13/2019   Procedure: RIGHT BREAST LUMPECTOMY WITH NEEDLE LOCALIZATION X2;  Surgeon: Fanny Skates, MD;  Location: [place];  Service: General;  Laterality: Right;   BREAST LUMPECTOMY WITH RADIOACTIVE SEED LOCALIZATION Right 03/13/2019   Procedure: RIGHT  BREAST LUMPECTOMY WITH RADIOACTIVE SEED LOCALIZATION;  Surgeon: Fanny Skates, MD;  Location: [place];  Service: General;  Laterality: Right;   CARPAL TUNNEL RELEASE Right 04/20/2019   Procedure: RIGHT CARPAL TUNNEL RELEASE;  Surgeon: Leanora Cover, MD;  Location: [place];  Service: Orthopedics;  Laterality: Right;   CARPAL TUNNEL RELEASE Left 05/15/2019   Procedure: LEFT CARPAL TUNNEL RELEASE;  Surgeon:  Leanora Cover, MD;  Location: [place];  Service: Orthopedics;  Laterality: Left;   DILATATION & CURETTAGE/HYSTEROSCOPY WITH MYOSURE N/A 03/03/2019   Procedure: DILATATION & CURETTAGE/HYSTEROSCOPY;  Surgeon: Nunzio Cobbs, MD;  Location: [facility];  Service: Gynecology;  Laterality: N/A;   NO PAST SURGERIES      SOCIAL HISTORY: Social History   Socioeconomic History   Marital status: Divorced    Spouse name: Not on file   Number of children: Not on file   Years of education: Not on file   Highest education level: Not on file  Occupational History   Not on file  Tobacco Use   Smoking status: Former   Smokeless tobacco: Never   Tobacco comments:    per patient smokes hemp smoked 04/18/19 per pt  Vaping Use   Vaping Use: Never used  Substance and Sexual Activity   Alcohol use: Yes    Comment: 2 drinks/month   Drug use: No   Sexual activity: Not Currently    Partners: Male    Birth control/protection: None, Condom, Abstinence  Other Topics Concern   Not on file  Social History Narrative   Single, no children   Right handed   Some college   < 1 cup  daily   Social Determinants of Health   Financial Resource Strain: Not on file  Food Insecurity: Not on file  Transportation Needs: Not on file  Physical Activity: Not on file  Stress: Not on file  Social Connections: Not on file  Intimate Partner Violence: Not on file    FAMILY HISTORY: Family History  Problem Relation Age of Onset   Cancer Mother 55       breast   Breast cancer Mother    Diabetes Father    Cancer Maternal Grandmother        breast--dec age 4   Breast cancer Maternal Grandmother    Cancer Maternal Aunt        unsure type, possibly pancreatic   Ovarian cancer Paternal Grandmother 54   Prostate cancer Paternal Uncle 86    Review of Systems  Constitutional:  Negative for appetite change, chills, fatigue, fever and unexpected weight change.  HENT:    Negative for hearing loss, lump/mass and trouble swallowing.   Eyes:  Negative for eye problems and icterus.  Respiratory:  Negative for chest tightness, cough and shortness of breath.   Cardiovascular:  Negative for chest pain, leg swelling and palpitations.  Gastrointestinal:  Negative for abdominal distention, abdominal pain, constipation, diarrhea, nausea and vomiting.  Endocrine: Negative for hot flashes.  Genitourinary:  Negative for difficulty urinating.   Musculoskeletal:  Negative for arthralgias.  Skin:  Negative for itching and rash.  Neurological:  Negative for dizziness, extremity weakness, headaches and numbness.  Hematological:  Negative for adenopathy. Does not bruise/bleed easily.  Psychiatric/Behavioral:  Negative for depression. The patient is not nervous/anxious.      PHYSICAL EXAMINATION  ECOG PERFORMANCE STATUS: 1 - Symptomatic but completely ambulatory  Vitals:   07/27/21 1125  BP: 133/86  Pulse: 82  Resp: 16  Temp: 97.7 F (36.5 C)  SpO2: 98%    Physical Exam Constitutional:      General: She is not in acute distress.    Appearance: Normal appearance. She is not toxic-appearing.  HENT:     Head: Normocephalic and atraumatic.  Eyes:     General: No scleral icterus. Cardiovascular:     Rate and Rhythm: Normal rate and regular rhythm.     Pulses: Normal pulses.     Heart sounds: Normal heart sounds.  Pulmonary:     Effort: Pulmonary effort is normal.     Breath sounds: Normal breath sounds.  Abdominal:     General: Abdomen is flat. Bowel sounds are normal. There is no distension.     Palpations: Abdomen is soft.     Tenderness: There is no abdominal tenderness.  Musculoskeletal:        General: No swelling.     Cervical back: Neck supple.  Lymphadenopathy:     Cervical: No cervical adenopathy.  Skin:    General: Skin is warm and dry.     Findings: No rash.  Neurological:     General: No focal deficit present.     Mental Status: She is  alert.  Psychiatric:        Mood and Affect: Mood normal.        Behavior: Behavior normal.    LABORATORY DATA:  CBC    Component Value Date/Time   WBC 9.8 12/15/2019 1631   WBC 8.8 03/03/2019 0620   RBC 4.97 12/15/2019 1631   RBC 5.04 03/03/2019 0620   HGB 12.4 12/15/2019 1631   HGB 11.6 (L) 05/25/2013 1625   HCT  39.8 12/15/2019 1631   PLT 275 12/15/2019 1631   MCV 80 12/15/2019 1631   MCH 24.9 (L) 12/15/2019 1631   MCH 25.6 (L) 03/03/2019 0620   MCHC 31.2 (L) 12/15/2019 1631   MCHC 30.6 03/03/2019 0620   RDW 13.3 12/15/2019 1631   LYMPHSABS 2.5 01/11/2016 1405   MONOABS 0.6 01/11/2016 1405   EOSABS 0.1 01/11/2016 1405   BASOSABS 0.0 01/11/2016 1405    CMP     Component Value Date/Time   NA 138 12/15/2019 1631   K 4.1 12/15/2019 1631   CL 103 12/15/2019 1631   CO2 23 12/15/2019 1631   GLUCOSE 93 12/15/2019 1631   GLUCOSE 103 (H) 03/03/2019 0620   BUN 9 12/15/2019 1631   CREATININE 0.76 12/15/2019 1631   CALCIUM 9.7 12/15/2019 1631   PROT 7.3 12/15/2019 1631   ALBUMIN 4.2 12/15/2019 1631   AST 12 12/15/2019 1631   ALT 17 12/15/2019 1631   ALKPHOS 87 12/15/2019 1631   BILITOT 0.2 12/15/2019 1631   GFRNONAA 98 12/15/2019 1631   GFRAA 113 12/15/2019 1631      ASSESSMENT and THERAPY PLAN:   Ductal papillomatosis of right breast Risk reduction: Tamoxifen 10 mg p.o. daily x5 years started 04/29/2019; stopped in 2021 and to restart March 2023.  Kristy Stafford and I discussed her breast cancer risk in detail.  I calculated her Cristine Polio Cusick risk which gives her 21% lifetime risk for breast cancer which is twice as much as someone else with her similar age and race.  For this reason she is eligible for either intensified screening or to restart risk reduction with tamoxifen.  We discussed both of these in detail.  1.  Intensified screening includes annual screening mammogram with supplementary breast MRI 6 months later.  We discussed the risks and benefits of this approach.   We reviewed the increased risk for false positives in addition the potential cost of the MRI.  2.  Risk reduction includes restarting tamoxifen 10 mg daily.  She can work with her gynecologist going ahead and get her IUD replaced due to her history of endometrial hyperplasia.  At 10 mg we are not as concerned as we will be at 20 mg for continued worsening of the hyperplasia, however that is something that would need to be monitored closely.  After discussion Kristy Stafford would like to continue with annual mammograms and restarting tamoxifen at 10 mg daily.  I sent this in for her.  She will follow-up with Dr. Quincy Simmonds in May 2023.  Should she decide to have additional MRI we are happy to place that order for her but at this point she would like to hold off on it.  We also discussed the issue of her rectal bleeding.  I placed a referral for [facility] to see if they have any different financial policy about upfront payments/payment plans since she is having trouble accessing care due to cost despite having insurance.  We also discussed possibly taking Colace daily every other day for constipation to keep her stool soft and prevent any anal fissures.  I suggested since she still having the hematochezia she could take a multivitamin with iron which may help prevent iron loss which can happen in instances such as these.  We also discussed other risk reduction methods including healthy diet and exercise.  I recommended 150 to 300 minutes a week as this is the number that has shown lower rates of cancer.  We will see Kristy Stafford back in November  2023.  This will put [place] on an alternating schedule with Dr. Quincy Simmonds with a 6 months split.    Orders Placed This Encounter  Procedures   Ambulatory referral to Gastroenterology    Referral Priority:   Routine    Referral Type:   Consultation    Referral Reason:   Specialty Services Required    Number of Visits Requested:   1    All questions were answered. The patient knows  to call the clinic with any problems, questions or concerns. We can certainly see the patient much sooner if necessary.  Total encounter time: 40 minutes in face-to-face visit time, chart review, lab review, care coordination, patient education, order entry, and documentation of the encounter.  Wilber Bihari, NP 07/27/21 2:23 PM Medical Oncology and Hematology [facility] [place], [place] [address] Tel. [phone]    Fax. [phone]  *Total Encounter Time as defined by the Centers for Medicare and Medicaid Services includes, in addition to the face-to-face time of a patient visit (documented in the note above) non-face-to-face time: obtaining and reviewing outside history, ordering and reviewing medications, tests or procedures, care coordination (communications with other health care professionals or caregivers) and documentation in the medical record.

## 2021-07-28 ENCOUNTER — Ambulatory Visit: Payer: No Typology Code available for payment source | Admitting: Hematology and Oncology

## 2021-12-25 ENCOUNTER — Other Ambulatory Visit: Payer: Self-pay | Admitting: Obstetrics and Gynecology

## 2021-12-25 DIAGNOSIS — Z1231 Encounter for screening mammogram for malignant neoplasm of breast: Secondary | ICD-10-CM

## 2022-01-15 DIAGNOSIS — Z1231 Encounter for screening mammogram for malignant neoplasm of breast: Secondary | ICD-10-CM

## 2022-01-31 ENCOUNTER — Ambulatory Visit
Admission: RE | Admit: 2022-01-31 | Discharge: 2022-01-31 | Disposition: A | Discharge status: Home / Self Care | Payer: No Typology Code available for payment source | Source: Ambulatory Visit | Attending: Obstetrics and Gynecology | Admitting: Obstetrics and Gynecology

## 2022-01-31 DIAGNOSIS — Z1231 Encounter for screening mammogram for malignant neoplasm of breast: Secondary | ICD-10-CM

## 2022-02-14 NOTE — Progress Notes (Unsigned)
46 y.o. G34P0030 Divorced [place] American female here for annual exam.   Requests all labs to be done through [facility].  Spotting 02-01-22; LNMP 07-11-21 - 07/25/21.  Also had cycle 06/07/21 - 06/14/21 and 03/08/21 - 03/15/21. No spotting in between her periods.   Her last EMB for follow up of endometrial hyperplasia was 04/12/21. It was benign with no hyperplasia or malignancy.  FSH 6.4 on 01/13/21.  She desires Mirena IUD.  Wants STD testing.   Had genetic testing showing 3 variants of undetermined significance.  Her lifetime risk of breast cancer is 21.8%. She is not on Tamoxifen.  Saw a GI about blood in her stool. She did not have a colonoscopy per choice.  The bleeding resolved.  She saw her PCP who thought the cause was hemorrhoids.   Dealing with anxiety.   She sees a therapist and psychiatrist.   PCP:   Lucianne Lei, MD  Patient's last menstrual period was 02/01/2022.     Period Pattern: (!) Irregular     Sexually active: Yes.   Only occ. The current method of family planning is condoms everytime.    Exercising: No.  The patient does not participate in regular exercise at present. Smoker:  Former  Health Maintenance: Pap:   12/11/18 Neg:Neg HR HPV, 06/23/12 Neg:Neg HR HPV History of abnormal Pap:  no MMG:  01-31-22 Neg/Birads2 Colonoscopy:  n/a BMD:   n/a  Result  n/a TDaP:  12-11-18 Gardasil:   COMPLETED HIV: 12-15-19 NR Hep C: 12-15-19 Neg Screening Labs:STD screening today.   reports that she has quit smoking. She has never used smokeless tobacco. She reports current alcohol use. She reports that she does not use drugs.  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  Slides reread by [facility] and determined to be simple endometrial hyperplasia.   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    Elevated hemoglobin A1c 2021   Family history of breast cancer    Family history of ovarian cancer    Family history of pancreatic cancer    Family history of prostate cancer    High anti-Mullerian hormone 2020   Hyperlipidemia    Medical history non-contributory    Nipple discharge    left breast   PCOS (polycystic ovarian syndrome)    Sleep apnea    has a cpap for 3 yr-told she will need to bring day of surgery and use post op    Past Surgical History:  Procedure Laterality Date   BREAST DUCTAL SYSTEM EXCISION Left 11/24/2012   Procedure: EXCISION DUCTAL SYSTEM BREAST;  Surgeon: Haywood Lasso, MD;  Location: [place];  Service: General;  Laterality: Left;   BREAST EXCISIONAL BIOPSY Left 2014   BREAST EXCISIONAL BIOPSY Right 03/13/2019   BREAST EXCISIONAL BIOPSY Right 03/13/2019   BREAST LUMPECTOMY WITH NEEDLE LOCALIZATION Right 03/13/2019   Procedure: RIGHT BREAST LUMPECTOMY WITH NEEDLE LOCALIZATION X2;  Surgeon: Fanny Skates, MD;  Location: [place];  Service: General;  Laterality: Right;   BREAST LUMPECTOMY WITH RADIOACTIVE SEED LOCALIZATION Right 03/13/2019   Procedure: RIGHT  BREAST LUMPECTOMY WITH RADIOACTIVE SEED LOCALIZATION;  Surgeon: Fanny Skates, MD;  Location: [place];  Service: General;  Laterality: Right;   CARPAL TUNNEL RELEASE Right 04/20/2019   Procedure: RIGHT CARPAL  TUNNEL RELEASE;  Surgeon: Leanora Cover, MD;  Location: [place];  Service: Orthopedics;  Laterality: Right;   CARPAL TUNNEL RELEASE Left 05/15/2019   Procedure: LEFT CARPAL TUNNEL RELEASE;  Surgeon: Leanora Cover, MD;  Location: [place];  Service: Orthopedics;  Laterality: Left;   DILATATION & CURETTAGE/HYSTEROSCOPY WITH MYOSURE N/A 03/03/2019   Procedure: DILATATION & CURETTAGE/HYSTEROSCOPY;  Surgeon: Nunzio Cobbs, MD;  Location: [facility];  Service: Gynecology;  Laterality: N/A;    NO PAST SURGERIES      Current Outpatient Medications  Medication Sig Dispense Refill   omeprazole (PRILOSEC) 40 MG capsule Take 40 mg by mouth every morning.     tamoxifen (NOLVADEX) 10 MG tablet Take 1 tablet (10 mg total) by mouth daily. 90 tablet 3   zolpidem (AMBIEN) 5 MG tablet Take 5 mg by mouth at bedtime.     No current facility-administered medications for this visit.    Family History  Problem Relation Age of Onset   Cancer Mother 59       breast   Breast cancer Mother    Diabetes Father    Cancer Maternal Grandmother        breast--dec age 7   Breast cancer Maternal Grandmother    Cancer Maternal Aunt        unsure type, possibly pancreatic   Ovarian cancer Paternal Grandmother 24   Prostate cancer Paternal Uncle 20    Review of Systems  All other systems reviewed and are negative.   Exam:   BP 122/80   Pulse 84   Ht 5' 3.5" (1.613 m)   Wt 204 lb (92.5 kg)   LMP 02/01/2022 Comment: spotting only  SpO2 98%   BMI 35.57 kg/m     General appearance: alert, cooperative and appears stated age Head: normocephalic, without obvious abnormality, atraumatic Neck: no adenopathy, supple, symmetrical, trachea midline and thyroid normal to inspection and palpation Lungs: clear to auscultation bilaterally Breasts: normal appearance, no masses or tenderness, No nipple retraction or dimpling, No nipple discharge or bleeding, No axillary adenopathy Heart: regular rate and rhythm Abdomen: soft, non-tender; no masses, no organomegaly Extremities: extremities normal, atraumatic, no cyanosis or edema Skin: skin color, texture, turgor normal. No rashes or lesions Lymph nodes: cervical, supraclavicular, and axillary nodes normal. Neurologic: grossly normal  Pelvic: External genitalia:  no lesions              No abnormal inguinal nodes palpated.              Urethra:  normal appearing urethra with no masses, tenderness or lesions              Bartholins and Skenes:  normal                 Vagina: normal appearing vagina with normal color and discharge, no lesions              Cervix: no lesions              Pap taken: no Bimanual Exam:  Uterus:  normal size, contour, position, consistency, mobility, non-tender              Adnexa: no mass, fullness, tenderness              Rectal exam: yes.  Confirms.              Anus:  normal sphincter tone, no lesions  Chaperone was present  for exam:  Estill Bamberg, CMA  Assessment:   Well woman visit with gynecologic exam. Hx oligomenorrhea.  Hx endometrial hyperplasia. Last EMB normal.  Increased risk for breast cancer. Not yet on Tamoxifen for breast cancer risk reduction.  Contraceptive care.  Hx expelled Mirena.   Plan: Mammogram screening discussed. Self breast awareness reviewed. Will schedule breast MRI Pap and HR HPV not indicated.  Guidelines for Calcium, Vitamin D, regular exercise program including cardiovascular and weight bearing exercise. Testing for GC?Chlamydia/trichomonas, HIV, RPR, Hep C.  All to [facility]. Return for endometrial biopsy and Mirena IUD insertion the same day. Follow up annually and prn.   After visit summary provided.

## 2022-02-15 ENCOUNTER — Telehealth: Payer: Self-pay | Admitting: Obstetrics and Gynecology

## 2022-02-15 ENCOUNTER — Encounter: Payer: Self-pay | Admitting: Obstetrics and Gynecology

## 2022-02-15 ENCOUNTER — Ambulatory Visit (INDEPENDENT_AMBULATORY_CARE_PROVIDER_SITE_OTHER): Payer: No Typology Code available for payment source | Admitting: Obstetrics and Gynecology

## 2022-02-15 VITALS — BP 122/80 | HR 84 | Ht 63.5 in | Wt 204.0 lb

## 2022-02-15 DIAGNOSIS — Z114 Encounter for screening for human immunodeficiency virus [HIV]: Secondary | ICD-10-CM

## 2022-02-15 DIAGNOSIS — Z3009 Encounter for other general counseling and advice on contraception: Secondary | ICD-10-CM

## 2022-02-15 DIAGNOSIS — Z113 Encounter for screening for infections with a predominantly sexual mode of transmission: Secondary | ICD-10-CM

## 2022-02-15 DIAGNOSIS — Z9189 Other specified personal risk factors, not elsewhere classified: Secondary | ICD-10-CM

## 2022-02-15 DIAGNOSIS — Z1159 Encounter for screening for other viral diseases: Secondary | ICD-10-CM

## 2022-02-15 DIAGNOSIS — Z8742 Personal history of other diseases of the female genital tract: Secondary | ICD-10-CM | POA: Diagnosis not present

## 2022-02-15 DIAGNOSIS — Z01419 Encounter for gynecological examination (general) (routine) without abnormal findings: Secondary | ICD-10-CM | POA: Diagnosis not present

## 2022-02-15 NOTE — Patient Instructions (Signed)

## 2022-02-15 NOTE — Telephone Encounter (Signed)
Please precert and schedule a breast MRI for my patient.  She has increased risk of breast cancer, 21.8%.

## 2022-02-15 NOTE — Telephone Encounter (Signed)
Order placed in Epic and I spoke with patient and provided phone number for [facility] so she can answer their screening questions/schedule.   I will monitor so once scheduled I can forward to KimAlexis/Jaylan to check on PA.

## 2022-02-18 LAB — CHLAMYDIA/GONOCOCCUS/TRICHOMONAS, NAA
Chlamydia by NAA: NEGATIVE
Gonococcus by NAA: NEGATIVE
Trich vag by NAA: NEGATIVE

## 2022-02-28 NOTE — Telephone Encounter (Signed)
I called patient and left detailed message on voicemail that [place] has called patient twice and no return call to schedule. I provided phone to [facility] again and asked to call or is she wishes not to proceed to let us know.

## 2022-02-28 NOTE — Telephone Encounter (Signed)
Breast MRI scheduled 03/17/22 at 3:10pm at [facility].  Routed to [place] to check on PA.

## 2022-03-03 ENCOUNTER — Telehealth: Payer: Self-pay | Admitting: Obstetrics and Gynecology

## 2022-03-03 NOTE — Telephone Encounter (Signed)
Please remind patient to have her labs drawn at [facility].  If she declines to do the blood work, I can cancel it for her.

## 2022-03-05 NOTE — Telephone Encounter (Signed)
Left message for patient to call.

## 2022-03-12 NOTE — Telephone Encounter (Signed)
Patient called back stating she still wants labs done,however [facility] was not able to see the labs that are enter in epic. I told her I will fax labs to [facility] on [address] per her request. [phone]

## 2022-03-12 NOTE — Telephone Encounter (Signed)
I signed the form for you to fax.  Thank you.

## 2022-03-13 NOTE — Telephone Encounter (Signed)
Order faxed/confirmation received.

## 2022-03-17 ENCOUNTER — Ambulatory Visit
Admission: RE | Admit: 2022-03-17 | Discharge: 2022-03-17 | Disposition: A | Discharge status: Home / Self Care | Payer: No Typology Code available for payment source | Source: Ambulatory Visit | Attending: Obstetrics and Gynecology | Admitting: Obstetrics and Gynecology

## 2022-03-17 DIAGNOSIS — Z9189 Other specified personal risk factors, not elsewhere classified: Secondary | ICD-10-CM

## 2022-03-17 MED ORDER — GADOPICLENOL 0.5 MMOL/ML IV SOLN
9.0000 mL | Freq: Once | INTRAVENOUS | Status: AC | PRN
  Administered 2022-03-17: 9 mL via INTRAVENOUS

## 2022-04-02 ENCOUNTER — Inpatient Hospital Stay: Payer: No Typology Code available for payment source | Admitting: Adult Health

## 2022-04-05 ENCOUNTER — Ambulatory Visit: Payer: No Typology Code available for payment source | Admitting: Obstetrics and Gynecology

## 2022-06-11 ENCOUNTER — Inpatient Hospital Stay: Payer: No Typology Code available for payment source | Attending: Adult Health | Admitting: Adult Health

## 2022-06-11 ENCOUNTER — Telehealth: Payer: Self-pay

## 2022-06-11 NOTE — Telephone Encounter (Signed)
Called and left a message regarding 1015 missed appt with Wilber Bihari, NP today. Ask her to call the office back to reschedule.

## 2022-12-14 NOTE — Progress Notes (Signed)
GYNECOLOGY  VISIT   HPI: 47 y.o.   Divorced  [place]  female   [phone] with Patient's last menstrual period was 12/11/2022.   here for   discuss BC.  Regular monthly cycles since September or October.  Menses last 6 days. Not heavy the 6 days. No bleeding in between cycles.  Does have cramping with cycles.   Hx endometrial hyperplasia.  Has lost weight.   Traveling to [place] in one month and she does not want to have her menstrual cycle.  Is off Tamoxifen for breast cancer prevention.   Denies hx HTN, migraine with aura, liver or breast disease, personal or family history of thromboembolic events.   Last sexually active in March, 2024.   Is a smoker.   GYNECOLOGIC HISTORY: Patient's last menstrual period was 12/11/2022. 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  Slides reread by [facility] and determined to be simple endometrial hyperplasia.   Depression    Ductal  papillomatosis of right breast 03/13/2019   Elevated hemoglobin A1c 2021   Family history of breast cancer    Family history of ovarian cancer    Family history of pancreatic cancer    Family history of prostate cancer    High anti-Mullerian hormone 2020   Hyperlipidemia    Medical history non-contributory    Nipple discharge    left breast   PCOS (polycystic ovarian syndrome)    Sleep apnea    has a cpap for 3 yr-told she will need to bring day of surgery and use post op    Past Surgical History:  Procedure Laterality Date   BREAST DUCTAL SYSTEM EXCISION Left 11/24/2012   Procedure: EXCISION DUCTAL SYSTEM BREAST;  Surgeon: Currie Paris, MD;  Location: [facility];  Service: General;  Laterality: Left;   BREAST EXCISIONAL BIOPSY Left 2014   BREAST EXCISIONAL BIOPSY Right 03/13/2019   BREAST EXCISIONAL BIOPSY Right 03/13/2019   BREAST LUMPECTOMY WITH NEEDLE LOCALIZATION Right 03/13/2019   Procedure: RIGHT BREAST LUMPECTOMY WITH NEEDLE LOCALIZATION X2;  Surgeon: Claud Kelp, MD;  Location: [facility];  Service: General;  Laterality: Right;   BREAST LUMPECTOMY WITH RADIOACTIVE SEED LOCALIZATION Right 03/13/2019   Procedure: RIGHT  BREAST LUMPECTOMY WITH RADIOACTIVE SEED LOCALIZATION;  Surgeon: Claud Kelp, MD;  Location: [facility];  Service: General;  Laterality: Right;   CARPAL TUNNEL RELEASE Right 04/20/2019   Procedure: RIGHT CARPAL TUNNEL RELEASE;  Surgeon: Betha Loa, MD;  Location: [facility];  Service: Orthopedics;  Laterality: Right;   CARPAL TUNNEL RELEASE Left 05/15/2019   Procedure: LEFT CARPAL TUNNEL RELEASE;  Surgeon: Betha Loa, MD;  Location: [facility];  Service: Orthopedics;  Laterality: Left;   DILATATION & CURETTAGE/HYSTEROSCOPY WITH MYOSURE N/A 03/03/2019   Procedure: DILATATION & CURETTAGE/HYSTEROSCOPY;  Surgeon: Patton Salles, MD;  Location: [facility];  Service: Gynecology;  Laterality: N/A;   NO PAST SURGERIES      Current Outpatient Medications  Medication Sig Dispense Refill   norethindrone (ORTHO MICRONOR) 0.35 MG tablet Take 1 tablet (0.35 mg total) by mouth daily. 84 tablet 1   omeprazole (PRILOSEC) 40 MG capsule Take 40 mg by mouth every morning.     No current facility-administered medications for this visit.     ALLERGIES: Patient has no known allergies.  Family History  Problem Relation Age of Onset   Cancer Mother 5       breast   Breast cancer Mother    Diabetes Father    Cancer Maternal Grandmother        breast--dec age 62   Breast cancer Maternal Grandmother    Cancer Maternal Aunt        unsure type, possibly pancreatic   Ovarian cancer Paternal Grandmother 74   Prostate cancer Paternal Uncle 78    Social History   Socioeconomic History   Marital status: Divorced    Spouse name: Not on file   Number of children: Not on file   Years of education: Not on file   Highest education level: Not on file  Occupational History   Not on file  Tobacco Use   Smoking status: Former   Smokeless tobacco: Never   Tobacco comments:    per patient smokes hemp smoked 04/18/19 per pt  Vaping Use   Vaping status: Never Used  Substance and Sexual Activity   Alcohol use: Yes    Comment: 1 drinks/month   Drug use: No   Sexual activity: Yes    Partners: Male    Birth control/protection: None, Condom    Comment: only occ. SA--condoms everytime  Other Topics Concern   Not on file  Social History Narrative   Single, no children   Right handed   Some college   < 1 cup daily   Social Determinants of Health   Financial Resource Strain: Not on file  Food Insecurity: Not on file  Transportation Needs: Not on file  Physical Activity: Not on file  Stress: Not on file  Social Connections: Not on file  Intimate Partner Violence: Not on file    Review of Systems  All other systems reviewed and are  negative.   PHYSICAL EXAMINATION:    BP 122/82 (BP Location: Right Arm, Patient Position: Sitting, Cuff Size: Normal)   Pulse 83   Ht 5' 3.5" (1.613 m)   Wt 200 lb (90.7 kg)   LMP 12/11/2022   SpO2 99%   BMI 34.87 kg/m     General appearance: alert, cooperative and appears stated age  ASSESSMENT  Hx oligomenorrhea.  Currently cycles are regular. Hx endometrial hyperplasia. Last EMB normal.  Increased risk for breast cancer. Negative genetic testing. Contraceptive care.  Hx expelled Mirena.  Off Tamoxifen for breast cancer prevention.  Smoker.  PLAN  Start Micronor.  3 packs, 1 refill.  Instructed in use.  Follow up for annual exam in December.

## 2022-12-25 ENCOUNTER — Encounter: Payer: Self-pay | Admitting: Obstetrics and Gynecology

## 2022-12-25 ENCOUNTER — Ambulatory Visit (INDEPENDENT_AMBULATORY_CARE_PROVIDER_SITE_OTHER): Payer: No Typology Code available for payment source | Admitting: Obstetrics and Gynecology

## 2022-12-25 VITALS — BP 122/82 | HR 83 | Ht 63.5 in | Wt 200.0 lb

## 2022-12-25 DIAGNOSIS — Z3009 Encounter for other general counseling and advice on contraception: Secondary | ICD-10-CM | POA: Diagnosis not present

## 2022-12-25 MED ORDER — NORETHINDRONE 0.35 MG PO TABS: 1.0000 | ORAL_TABLET | Freq: Every day | ORAL | 1 refills | Status: DC

## 2022-12-25 NOTE — Patient Instructions (Signed)
Norethindrone Tablets (Contraception) What is this medication? NORETHINDRONE (nor eth IN drone) prevents ovulation and pregnancy. It belongs to a group of medications called contraceptives. This medication is a progestin hormone. This medicine may be used for other purposes; ask your health care provider or pharmacist if you have questions. COMMON BRAND NAME(S): Camila, Deblitane 28-Day, [place], [place], [place], Jolivette, Ten Sleep, Nor-QD, Nora-BE, Norlyroc, Ortho Micronor, [employer] 28-Day What should I tell my care team before I take this medication? They need to know if you have any of these conditions: Blood vessel disease or blood clots Breast, cervical, or vaginal cancer Diabetes Heart disease Kidney disease Liver disease Mental depression Migraine Seizures Stroke Vaginal bleeding An unusual or allergic reaction to norethindrone, other medications, foods, dyes, or preservatives Pregnant or trying to get pregnant Breast-feeding How should I use this medication? Take this medication by mouth with a glass of water. You may take it with or without food. Follow the directions on the prescription label. Take this medication at the same time each day and in the order directed on the package. Do not take your medication more often than directed. Contact your care team about the use of this medication in children. Special care may be needed. This medication has been used in female children who have started having menstrual periods. A patient package insert for the product will be given with each prescription and refill. Read this sheet carefully each time. The sheet may change frequently. Overdosage: If you think you have taken too much of this medicine contact a poison control center or emergency room at once. NOTE: This medicine is only for you. Do not share this medicine with others. What if I miss a dose? If you miss a dose, refer to the patient package insert for instruction. This  medication may not work as well if you miss more than 1 pill. You may need to use back-up contraception. What may interact with this medication? Do not take this medication with any of the following: Amprenavir or fosamprenavir Bosentan This medication may also interact with the following: Antibiotics or medications for infections, especially rifampin, rifabutin, rifapentine, and griseofulvin, and possibly penicillins or tetracyclines Aprepitant Barbiturate medications, such as phenobarbital Carbamazepine Felbamate Modafinil Oxcarbazepine Phenytoin Ritonavir or other medications for HIV infection or AIDS St. John's wort Topiramate This list may not describe all possible interactions. Give your health care provider a list of all the medicines, herbs, non-prescription drugs, or dietary supplements you use. Also tell them if you smoke, drink alcohol, or use illegal drugs. Some items may interact with your medicine. What should I watch for while using this medication? Visit your care team for regular health checks while on this medication. You may need to use another form of contraception, such as a condom, during the first cycle of this medication. If you may be pregnant, stop taking this medication right away and contact your care team. What side effects may I notice from receiving this medication? Side effects that you should report to your care team as soon as possible: Allergic reactions--skin rash, itching, hives, swelling of the face, lips, tongue, or throat Blood clot--pain, swelling, or warmth in the leg, shortness of breath, chest pain Gallbladder problems--severe stomach pain, nausea, vomiting, fever Increase in blood pressure Liver injury--right upper belly pain, loss of appetite, nausea, light-colored stool, dark yellow or brown urine, yellowing skin or eyes, unusual weakness or fatigue New or worsening migraines or headaches Stroke--sudden numbness or weakness of the face,  arm, or leg,  trouble speaking, confusion, trouble walking, loss of balance or coordination, dizziness, severe headache, change in vision Unusual vaginal discharge, itching, or odor Worsening mood, feelings of depression Side effects that usually do not require medical attention (report to your care team if they continue or are bothersome): Breast pain or tenderness Dark patches of skin on the face or other sun-exposed areas Irregular menstrual cycles or spotting Nausea Weight gain This list may not describe all possible side effects. Call your doctor for medical advice about side effects. You may report side effects to FDA at 1-800-FDA-1088. Where should I keep my medication? Keep out of the reach of children and pets. Store at room temperature between 15 and 30 degrees C (59 and 86 degrees F). Throw away any unused medication after the expiration date. NOTE: This sheet is a summary. It may not cover all possible information. If you have questions about this medicine, talk to your doctor, pharmacist, or health care provider.  2024 Elsevier/Gold Standard (2022-10-19 00:00:00)

## 2023-01-22 ENCOUNTER — Other Ambulatory Visit: Payer: Self-pay | Admitting: Obstetrics and Gynecology

## 2023-01-22 DIAGNOSIS — Z1231 Encounter for screening mammogram for malignant neoplasm of breast: Secondary | ICD-10-CM

## 2023-02-15 ENCOUNTER — Ambulatory Visit
Admission: RE | Admit: 2023-02-15 | Discharge: 2023-02-15 | Disposition: A | Discharge status: Home / Self Care | Payer: No Typology Code available for payment source | Source: Ambulatory Visit | Attending: Obstetrics and Gynecology | Admitting: Obstetrics and Gynecology

## 2023-02-15 DIAGNOSIS — Z1231 Encounter for screening mammogram for malignant neoplasm of breast: Secondary | ICD-10-CM

## 2023-04-16 NOTE — Progress Notes (Unsigned)
46 y.o. [id number] Divorced [place] American female here for annual exam.    Menstrual cycles are regular for the last year.  Missed one or two cycles.  Menses last 6 days, first 2 days are heavy.  No bleeding in between cycles.   She stopped taking the Micronor due to spotting, two months ago.  It was hard to take them on time.   Not sexually active, but may like to have Micronor refill.   Wants STD testing through [facility].   Notes acne that leaves scarring.   PCP: Renaye Rakers, MD   Patient's last menstrual period was 03/28/2023.     Period Cycle (Days): 32 Period Duration (Days): 4 Period Pattern: Regular Menstrual Flow: Heavy, Light Menstrual Control: Tampon, Maxi pad Dysmenorrhea: (!) Moderate     Sexually active: Yes.    The current method of family planning is condoms sometimes/Micronor.    Menopausal hormone therapy:  n/a Exercising: No.   Smoker:  former  OB History  Gravida Para Term Preterm AB Living  3       3    SAB IAB Ectopic Multiple Live Births  3            # Outcome Date GA Lbr Len/2nd Weight Sex Type Anes PTL Lv  3 SAB           2 SAB           1 SAB              HEALTH MAINTENANCE: Last 2 paps:  12/11/18 neg: HR HPV neg History of abnormal Pap or positive HPV:  no Mammogram:   02/15/23 Breast Density Cat B, BI-RADS CAT 1 neg Colonoscopy:  12/2022 per pt.  Dx sounds like colitis.  Dr. Ranae Palms.  Bone Density:  n/a  Result  n/a   Immunization History  Administered Date(s) Administered   HPV 9-valent 12/11/2018, 02/13/2019, 12/15/2019   Tdap 12/11/2018      reports that she has quit smoking. She has never used smokeless tobacco. She reports current alcohol use. She reports that she does not use drugs.  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  Slides reread by [facility] and determined  to be simple endometrial hyperplasia.   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   Elevated hemoglobin A1c 2021   Family history of breast cancer    Family history of ovarian cancer    Family history of pancreatic cancer    Family history of prostate cancer    High anti-Mullerian hormone 2020   Hyperlipidemia    Medical history non-contributory    Nipple discharge    left breast   PCOS (polycystic ovarian syndrome)    Sleep apnea    has a cpap for 3 yr-told she will need to bring day of surgery and use post op    Past Surgical History:  Procedure Laterality Date   BREAST DUCTAL SYSTEM EXCISION Left 11/24/2012   Procedure: EXCISION DUCTAL SYSTEM BREAST;  Surgeon: Currie Paris, MD;  Location: [facility];  Service: General;  Laterality: Left;   BREAST EXCISIONAL BIOPSY Left 2014   BREAST EXCISIONAL BIOPSY Right 03/13/2019   BREAST EXCISIONAL BIOPSY Right 03/13/2019   BREAST LUMPECTOMY WITH NEEDLE LOCALIZATION Right 03/13/2019   Procedure: RIGHT  BREAST LUMPECTOMY WITH NEEDLE LOCALIZATION X2;  Surgeon: Claud Kelp, MD;  Location: [facility];  Service: General;  Laterality: Right;   BREAST LUMPECTOMY WITH RADIOACTIVE SEED LOCALIZATION Right 03/13/2019   Procedure: RIGHT  BREAST LUMPECTOMY WITH RADIOACTIVE SEED LOCALIZATION;  Surgeon: Claud Kelp, MD;  Location: [facility];  Service: General;  Laterality: Right;   CARPAL TUNNEL RELEASE Right 04/20/2019   Procedure: RIGHT CARPAL TUNNEL RELEASE;  Surgeon: Betha Loa, MD;  Location: [facility];  Service: Orthopedics;  Laterality: Right;   CARPAL TUNNEL RELEASE Left 05/15/2019   Procedure: LEFT CARPAL TUNNEL RELEASE;  Surgeon: Betha Loa, MD;  Location: [facility];  Service: Orthopedics;  Laterality: Left;   DILATATION & CURETTAGE/HYSTEROSCOPY WITH MYOSURE N/A 03/03/2019   Procedure: DILATATION & CURETTAGE/HYSTEROSCOPY;  Surgeon:  Patton Salles, MD;  Location: [facility];  Service: Gynecology;  Laterality: N/A;   NO PAST SURGERIES      Current Outpatient Medications  Medication Sig Dispense Refill   ibuprofen (ADVIL) 800 MG tablet Take 800 mg by mouth every 6 (six) hours as needed.     mesalamine (CANASA) 1000 MG suppository Place 1,000 mg rectally at bedtime.     mesalamine (LIALDA) 1.2 g EC tablet 2 tab(s) orally twice a day for 90 days     norethindrone (ORTHO MICRONOR) 0.35 MG tablet Take 1 tablet (0.35 mg total) by mouth daily. 84 tablet 1   omeprazole (PRILOSEC) 40 MG capsule Take 40 mg by mouth every morning.     No current facility-administered medications for this visit.    ALLERGIES: Patient has no known allergies.  Family History  Problem Relation Age of Onset   Cancer Mother 52       breast   Breast cancer Mother    Diabetes Father    Cancer Maternal Grandmother        breast--dec age 101   Breast cancer Maternal Grandmother    Cancer Maternal Aunt        unsure type, possibly pancreatic   Ovarian cancer Paternal Grandmother 64   Prostate cancer Paternal Uncle 90    Review of Systems  All other systems reviewed and are negative.   PHYSICAL EXAM:  BP 130/72 (BP Location: Left Arm, Patient Position: Sitting, Cuff Size: Normal)   Ht 5' 5.25" (1.657 m)   Wt 203 lb (92.1 kg)   LMP 03/28/2023   BMI 33.52 kg/m     General appearance: alert, cooperative and appears stated age Head: normocephalic, without obvious abnormality, atraumatic Neck: no adenopathy, supple, symmetrical, trachea midline and thyroid normal to inspection and palpation Lungs: clear to auscultation bilaterally Breasts: normal appearance, no masses or tenderness, No nipple retraction or dimpling, No nipple discharge or bleeding, No axillary adenopathy Heart: regular rate and rhythm Abdomen: soft, non-tender; no masses, no organomegaly Extremities: extremities normal, atraumatic, no cyanosis or  edema Skin: skin color, texture, turgor normal. No rashes or lesions Lymph nodes: cervical, supraclavicular, and axillary nodes normal. Neurologic: grossly normal  Pelvic: External genitalia:  no lesions              No abnormal inguinal nodes palpated.              Urethra:  normal appearing urethra with no masses, tenderness or lesions              Bartholins and Skenes: normal  Vagina: normal appearing vagina with normal color and discharge, no lesions              Cervix: no lesions              Pap taken: No. Bimanual Exam:  Uterus:  normal size, contour, position, consistency, mobility, non-tender              Adnexa: no mass, fullness, tenderness              Rectal exam: Yes.  .  Confirms.              Anus:  normal sphincter tone, no lesions  Chaperone was present for exam:  Warren Lacy, CMA  ASSESSMENT: Well woman visit with gynecologic exam. Hx oligomenorrhea.  Cycles now quite regular. Hx endometrial hyperplasia. Last EMB normal.  Increased risk for breast cancer. Not on Tamoxifen for breast cancer risk reduction.  Contraceptive care.  Hx expelled Mirena.  Acne.  PLAN: Mammogram screening discussed. Self breast awareness reviewed. Pap and HRV collected:  No. Guidelines for Calcium, Vitamin D, regular exercise program including cardiovascular and weight bearing exercise. Medication refills:  Micronor. STD screening.  Swab collected for GC/CT/trich.  She will go to a [facility] location for serum STD screening for HIV, syphilis, and hep C.  She will establish care with dermatology.  She is aware that the Micronor may contribute to acne.  Follow up:  yearly and prn.

## 2023-04-30 ENCOUNTER — Encounter: Payer: Self-pay | Admitting: Obstetrics and Gynecology

## 2023-04-30 ENCOUNTER — Ambulatory Visit (INDEPENDENT_AMBULATORY_CARE_PROVIDER_SITE_OTHER): Payer: No Typology Code available for payment source | Admitting: Obstetrics and Gynecology

## 2023-04-30 VITALS — BP 130/72 | Ht 65.25 in | Wt 203.0 lb

## 2023-04-30 DIAGNOSIS — L709 Acne, unspecified: Secondary | ICD-10-CM | POA: Diagnosis not present

## 2023-04-30 DIAGNOSIS — Z113 Encounter for screening for infections with a predominantly sexual mode of transmission: Secondary | ICD-10-CM | POA: Diagnosis not present

## 2023-04-30 DIAGNOSIS — Z01419 Encounter for gynecological examination (general) (routine) without abnormal findings: Secondary | ICD-10-CM

## 2023-04-30 DIAGNOSIS — Z1159 Encounter for screening for other viral diseases: Secondary | ICD-10-CM

## 2023-04-30 DIAGNOSIS — Z114 Encounter for screening for human immunodeficiency virus [HIV]: Secondary | ICD-10-CM

## 2023-04-30 MED ORDER — NORETHINDRONE 0.35 MG PO TABS: 1.0000 | ORAL_TABLET | Freq: Every day | ORAL | 3 refills | Status: DC

## 2023-04-30 NOTE — Patient Instructions (Signed)

## 2023-05-02 LAB — STI PROFILE, CT/NG/TV
Chlamydia by NAA: NEGATIVE
Gonococcus by NAA: NEGATIVE
Trich vag by NAA: NEGATIVE

## 2023-06-14 ENCOUNTER — Other Ambulatory Visit: Payer: Self-pay | Admitting: Obstetrics and Gynecology

## 2023-06-14 NOTE — Telephone Encounter (Signed)
Med refill request: Ortho Micronor Last AEX: 04/30/23 Next AEX: 04/30/24 Last MMG (if hormonal med) 02/15/23 Refill authorized: Please Advise?

## 2023-12-24 NOTE — Procedures (Signed)
Result scanned to media

## 2024-03-16 ENCOUNTER — Other Ambulatory Visit: Payer: Self-pay | Admitting: Obstetrics and Gynecology

## 2024-03-16 DIAGNOSIS — Z1231 Encounter for screening mammogram for malignant neoplasm of breast: Secondary | ICD-10-CM

## 2024-04-07 ENCOUNTER — Ambulatory Visit
Admission: RE | Admit: 2024-04-07 | Discharge: 2024-04-07 | Disposition: A | Discharge status: Home / Self Care | Source: Ambulatory Visit

## 2024-04-07 DIAGNOSIS — Z1231 Encounter for screening mammogram for malignant neoplasm of breast: Secondary | ICD-10-CM

## 2024-04-09 ENCOUNTER — Ambulatory Visit: Payer: Self-pay | Admitting: Obstetrics and Gynecology

## 2024-04-29 ENCOUNTER — Encounter: Payer: Self-pay | Admitting: Obstetrics and Gynecology

## 2024-04-29 NOTE — Progress Notes (Deleted)
 48 y.o. G49P0030 Divorced African American female here for annual exam.    PCP: Ilah Crigler, MD   No LMP recorded.           Sexually active: Yes.    The current method of family planning is OCP (estrogen/progesterone).    Menopausal hormone therapy:  n/a Exercising: {yes no:314532}  {types:19826} Smoker:  Former   OB History  Gravida Para Term Preterm AB Living  3    3   SAB IAB Ectopic Multiple Live Births  3        # Outcome Date GA Lbr Len/2nd Weight Sex Type Anes PTL Lv  3 SAB           2 SAB           1 SAB              HEALTH MAINTENANCE: Last 2 paps:  12/11/18 neg HR HPV neg History of abnormal Pap or positive HPV:  no Mammogram:   04/07/24 Breast Density Cat B, BIRADS Cat 1 neg  Colonoscopy:  12/2022 per pt.  Dx sounds like colitis.  Dr. Narda Sous Bone Density:  n/a  Result  n/a   Immunization History  Administered Date(s) Administered   HPV 9-valent 12/11/2018, 02/13/2019, 12/15/2019   Tdap 12/11/2018      reports that she has quit smoking. She has never used smokeless tobacco. She reports current alcohol use. She reports that she does not use drugs.  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  Slides reread by [facility] and determined to be simple endometrial hyperplasia.   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   Elevated hemoglobin A1c 2021   Family history of breast cancer    Family history of ovarian cancer    Family history of pancreatic cancer    Family history of prostate cancer    High anti-Mullerian hormone 2020   Hyperlipidemia    Medical history non-contributory    Nipple discharge    left breast   PCOS (polycystic ovarian syndrome)    Sleep apnea    has a cpap for 3 yr-told she will need to bring day of surgery and use post op    Past Surgical History:   Procedure Laterality Date   BREAST DUCTAL SYSTEM EXCISION Left 11/24/2012   Procedure: EXCISION DUCTAL SYSTEM BREAST;  Surgeon: Sherlean JINNY Laughter, MD;  Location: [facility];  Service: General;  Laterality: Left;   BREAST EXCISIONAL BIOPSY Left 2014   BREAST EXCISIONAL BIOPSY Right 03/13/2019   BREAST EXCISIONAL BIOPSY Right 03/13/2019   BREAST LUMPECTOMY WITH NEEDLE LOCALIZATION Right 03/13/2019   Procedure: RIGHT BREAST LUMPECTOMY WITH NEEDLE LOCALIZATION X2;  Surgeon: Gail Favorite, MD;  Location: [facility];  Service: General;  Laterality: Right;   BREAST LUMPECTOMY WITH RADIOACTIVE SEED LOCALIZATION Right 03/13/2019   Procedure: RIGHT  BREAST LUMPECTOMY WITH RADIOACTIVE SEED LOCALIZATION;  Surgeon: Gail Favorite, MD;  Location: [facility];  Service: General;  Laterality: Right;   CARPAL TUNNEL RELEASE Right 04/20/2019   Procedure: RIGHT CARPAL TUNNEL RELEASE;  Surgeon: Murrell Drivers, MD;  Location: [facility];  Service: Orthopedics;  Laterality: Right;   CARPAL TUNNEL RELEASE Left 05/15/2019   Procedure: LEFT CARPAL TUNNEL RELEASE;  Surgeon: Murrell Drivers,  MD;  Location: [facility];  Service: Orthopedics;  Laterality: Left;   DILATATION & CURETTAGE/HYSTEROSCOPY WITH MYOSURE N/A 03/03/2019   Procedure: DILATATION & CURETTAGE/HYSTEROSCOPY;  Surgeon: Cathlyn JAYSON Nikki Bobie FORBES, MD;  Location: [facility];  Service: Gynecology;  Laterality: N/A;   NO PAST SURGERIES      Current Outpatient Medications  Medication Sig Dispense Refill   ibuprofen  (ADVIL ) 800 MG tablet Take 800 mg by mouth every 6 (six) hours as needed.     mesalamine (CANASA) 1000 MG suppository Place 1,000 mg rectally at bedtime.     mesalamine (LIALDA) 1.2 g EC tablet 2 tab(s) orally twice a day for 90 days     norethindrone  (MICRONOR ) 0.35 MG tablet TAKE 1 TABLET(0.35 MG) BY MOUTH DAILY 84 tablet 3   omeprazole (PRILOSEC) 40 MG  capsule Take 40 mg by mouth every morning.     No current facility-administered medications for this visit.    ALLERGIES: Patient has no known allergies.  Family History  Problem Relation Age of Onset   Cancer Mother 2       breast   Breast cancer Mother    Diabetes Father    Cancer Maternal Grandmother        breast--dec age 53   Breast cancer Maternal Grandmother    Cancer Maternal Aunt        unsure type, possibly pancreatic   Ovarian cancer Paternal Grandmother 7   Prostate cancer Paternal Uncle 76    Review of Systems  PHYSICAL EXAM:  There were no vitals taken for this visit.    General appearance: alert, cooperative and appears stated age Head: normocephalic, without obvious abnormality, atraumatic Neck: no adenopathy, supple, symmetrical, trachea midline and thyroid  normal to inspection and palpation Lungs: clear to auscultation bilaterally Breasts: normal appearance, no masses or tenderness, No nipple retraction or dimpling, No nipple discharge or bleeding, No axillary adenopathy Heart: regular rate and rhythm Abdomen: soft, non-tender; no masses, no organomegaly Extremities: extremities normal, atraumatic, no cyanosis or edema Skin: skin color, texture, turgor normal. No rashes or lesions Lymph nodes: cervical, supraclavicular, and axillary nodes normal. Neurologic: grossly normal  Pelvic: External genitalia:  no lesions              No abnormal inguinal nodes palpated.              Urethra:  normal appearing urethra with no masses, tenderness or lesions              Bartholins and Skenes: normal                 Vagina: normal appearing vagina with normal color and discharge, no lesions              Cervix: no lesions              Pap taken: {yes no:314532} Bimanual Exam:  Uterus:  normal size, contour, position, consistency, mobility, non-tender              Adnexa: no mass, fullness, tenderness              Rectal exam: {yes no:314532}.  Confirms.               Anus:  normal sphincter tone, no lesions  Chaperone was present for exam:  {BSCHAPERONE:31226::Emily F, CMA}  ASSESSMENT: Well woman visit with gynecologic exam.  PHQ-2-9: ***  ***  PLAN: Mammogram screening discussed. Self breast awareness reviewed. Pap  and HRV collected:  {yes no:314532} Guidelines for Calcium, Vitamin D , regular exercise program including cardiovascular and weight bearing exercise. Medication refills:  *** {LABS (Optional):23779} Follow up:  ***    Additional counseling given.  {yes [id number]. ***  total time was spent for this patient encounter, including preparation, face-to-face counseling with the patient, coordination of care, and documentation of the encounter in addition to doing the well woman visit with gynecologic exam.

## 2024-04-30 ENCOUNTER — Ambulatory Visit: Payer: No Typology Code available for payment source | Admitting: Obstetrics and Gynecology

## 2024-10-29 ENCOUNTER — Ambulatory Visit: Admitting: Obstetrics and Gynecology
